# Patient Record
Sex: Female | Born: 1960 | Race: Black or African American | Hispanic: No | Marital: Single | Smoking: Never smoker
Health system: Southern US, Community
[De-identification: ages and names within clinical notes are randomized; demographics above are authoritative.]

## PROBLEM LIST (undated history)

## (undated) DIAGNOSIS — M797 Fibromyalgia: Secondary | ICD-10-CM

## (undated) DIAGNOSIS — E119 Type 2 diabetes mellitus without complications: Secondary | ICD-10-CM

## (undated) DIAGNOSIS — D259 Leiomyoma of uterus, unspecified: Secondary | ICD-10-CM

## (undated) DIAGNOSIS — I10 Essential (primary) hypertension: Secondary | ICD-10-CM

## (undated) HISTORY — PX: BREAST SURGERY: SHX581

## (undated) HISTORY — DX: Type 2 diabetes mellitus without complications: E11.9

## (undated) HISTORY — DX: Essential (primary) hypertension: I10

## (undated) HISTORY — PX: ABDOMINAL HYSTERECTOMY: SHX81

---

## 2011-08-02 ENCOUNTER — Emergency Department (HOSPITAL_BASED_OUTPATIENT_CLINIC_OR_DEPARTMENT_OTHER)
Admission: EM | Admit: 2011-08-02 | Discharge: 2011-08-02 | Disposition: A | Discharge status: Home / Self Care | Payer: Self-pay | Attending: Emergency Medicine | Admitting: Emergency Medicine

## 2011-08-02 ENCOUNTER — Encounter: Payer: Self-pay | Admitting: Emergency Medicine

## 2011-08-02 DIAGNOSIS — N644 Mastodynia: Secondary | ICD-10-CM | POA: Insufficient documentation

## 2011-08-02 HISTORY — DX: Leiomyoma of uterus, unspecified: D25.9

## 2011-08-02 MED ORDER — HYDROCODONE-ACETAMINOPHEN 5-325 MG PO TABS: 1.0000 | ORAL_TABLET | ORAL | Status: AC | PRN

## 2011-08-02 NOTE — ED Provider Notes (Addendum)
History     CSN: [id number] Arrival date & time: 08/02/2011  8:11 AM   None     Chief Complaint  Patient presents with  . Breast Pain    (Consider location/radiation/quality/duration/timing/severity/associated sxs/prior treatment) HPI Comments: Patient is a 50 year old woman who complains of pain in both breasts. She has a prior history of lumps in her breast, and has had breast biopsies on the left breast and also removal of systems left nipple. She had her last mammogram in November of last year at [facility]. She had seen her Dr., a Dr. Nedra Hai at a community clinic in [facility], who did order her mammogram, saying that the patient had too many mammograms. She therefore [facility] for evaluation.   Past Medical History  Diagnosis Date  . Fibroid, uterine     Past Surgical History  Procedure Date  . Breast surgery     benign mass removed  . Abdominal hysterectomy     History reviewed. No pertinent family history.  History  Substance Use Topics  . Smoking status: Never Smoker   . Smokeless tobacco: Not on file  . Alcohol Use: No    OB History    Grav Para Term Preterm Abortions TAB SAB Ect Mult Living                  Review of Systems  Constitutional: Negative.   HENT: Negative.   Eyes: Negative.   Respiratory: Negative.   Cardiovascular: Negative.   Gastrointestinal: Negative.   Genitourinary:       She has tenderness in both breasts.  Musculoskeletal: Negative.   Neurological: Negative.   Psychiatric/Behavioral: Negative.     Allergies  Review of patient's allergies indicates no known allergies.  Home Medications   Current Outpatient Rx  Name Route Sig Dispense Refill  . UNKNOWN TO PATIENT       . HYDROCODONE-ACETAMINOPHEN 5-325 MG PO TABS Oral Take 1 tablet by mouth every 4 (four) hours as needed for pain. 20 tablet 0    BP 127/89  Pulse 76  Temp(Src) 98.2 F (36.8 C) (Oral)  Resp  18  Ht 5\' 4"  (1.626 m)  Wt 174 lb (78.926 kg)  BMI 29.87 kg/m2  SpO2 100%  Physical Exam  Constitutional: She appears well-developed and well-nourished. No distress.  HENT:  Head: Normocephalic.  Eyes: EOM are normal. Pupils are equal, round, and reactive to light.  Neck: Normal range of motion. Neck supple.  Cardiovascular: Normal rate, regular rhythm and normal heart sounds.   Pulmonary/Chest: Effort normal and breath sounds normal.       She has tenderness over both breasts, but there is no mass. She has a scar on the upper surface of her left breast and under the left areola. There is no nipple discharge no axillary adenopathy.  Abdominal: Soft. Bowel sounds are normal.  Neurological: She is alert. She has normal reflexes.       No sensory or motor deficit.  Skin: Skin is warm and dry.  Psychiatric: She has a normal mood and affect. Her behavior is normal.    ED Course  Procedures (including critical care time)  9:00 AM Patient was seen and had physical examination. I ordered a bilateral mammogram for her, to be done at [facility]. I think the risk of radiation is less than the risk of missing a mass.    1. Breast pain  Carleene Cooper III, MD 08/02/11 1610  Carleene Cooper III, MD 08/03/11 [phone]

## 2011-08-02 NOTE — ED Notes (Addendum)
Pt having bilateral breast pain since last week.  Saw Md and was told she has cysts.  Was given medication for pain but is not helping.  Pt denies any nipple drainage or fever.

## 2012-11-15 ENCOUNTER — Emergency Department (HOSPITAL_BASED_OUTPATIENT_CLINIC_OR_DEPARTMENT_OTHER): Payer: No Typology Code available for payment source

## 2012-11-15 ENCOUNTER — Emergency Department (HOSPITAL_BASED_OUTPATIENT_CLINIC_OR_DEPARTMENT_OTHER)
Admission: EM | Admit: 2012-11-15 | Discharge: 2012-11-15 | Disposition: A | Discharge status: Home / Self Care | Payer: No Typology Code available for payment source | Attending: Emergency Medicine | Admitting: Emergency Medicine

## 2012-11-15 ENCOUNTER — Encounter (HOSPITAL_BASED_OUTPATIENT_CLINIC_OR_DEPARTMENT_OTHER): Payer: Self-pay | Admitting: *Deleted

## 2012-11-15 DIAGNOSIS — Y92410 Unspecified street and highway as the place of occurrence of the external cause: Secondary | ICD-10-CM | POA: Insufficient documentation

## 2012-11-15 DIAGNOSIS — S199XXA Unspecified injury of neck, initial encounter: Secondary | ICD-10-CM | POA: Insufficient documentation

## 2012-11-15 DIAGNOSIS — Y9389 Activity, other specified: Secondary | ICD-10-CM | POA: Insufficient documentation

## 2012-11-15 DIAGNOSIS — S298XXA Other specified injuries of thorax, initial encounter: Secondary | ICD-10-CM | POA: Insufficient documentation

## 2012-11-15 DIAGNOSIS — T148XXA Other injury of unspecified body region, initial encounter: Secondary | ICD-10-CM | POA: Insufficient documentation

## 2012-11-15 DIAGNOSIS — S3981XA Other specified injuries of abdomen, initial encounter: Secondary | ICD-10-CM | POA: Insufficient documentation

## 2012-11-15 DIAGNOSIS — S0993XA Unspecified injury of face, initial encounter: Secondary | ICD-10-CM | POA: Insufficient documentation

## 2012-11-15 DIAGNOSIS — Z8742 Personal history of other diseases of the female genital tract: Secondary | ICD-10-CM | POA: Insufficient documentation

## 2012-11-15 DIAGNOSIS — S99919A Unspecified injury of unspecified ankle, initial encounter: Secondary | ICD-10-CM | POA: Insufficient documentation

## 2012-11-15 DIAGNOSIS — S8990XA Unspecified injury of unspecified lower leg, initial encounter: Secondary | ICD-10-CM | POA: Insufficient documentation

## 2012-11-15 DIAGNOSIS — IMO0002 Reserved for concepts with insufficient information to code with codable children: Secondary | ICD-10-CM | POA: Insufficient documentation

## 2012-11-15 DIAGNOSIS — T07XXXA Unspecified multiple injuries, initial encounter: Secondary | ICD-10-CM | POA: Insufficient documentation

## 2012-11-15 LAB — BASIC METABOLIC PANEL
BUN: 9 mg/dL (ref 6–23)
CO2: 27 mEq/L (ref 19–32)
Chloride: 105 mEq/L (ref 96–112)
Creatinine, Ser: 1 mg/dL (ref 0.50–1.10)
Glucose, Bld: 92 mg/dL (ref 70–99)

## 2012-11-15 MED ORDER — ONDANSETRON HCL 4 MG/2ML IJ SOLN
4.0000 mg | Freq: Once | INTRAMUSCULAR | Status: AC
  Administered 2012-11-15: 4 mg via INTRAVENOUS
  Filled 2012-11-15: qty 2

## 2012-11-15 MED ORDER — IOHEXOL 300 MG/ML  SOLN
100.0000 mL | Freq: Once | INTRAMUSCULAR | Status: AC | PRN
  Administered 2012-11-15: 100 mL via INTRAVENOUS

## 2012-11-15 MED ORDER — MORPHINE SULFATE 4 MG/ML IJ SOLN
4.0000 mg | Freq: Once | INTRAMUSCULAR | Status: AC
  Administered 2012-11-15: 4 mg via INTRAVENOUS
  Filled 2012-11-15: qty 1

## 2012-11-15 MED ORDER — OXYCODONE-ACETAMINOPHEN 5-325 MG PO TABS: 1.0000 | ORAL_TABLET | Freq: Four times a day (QID) | ORAL | Status: DC | PRN

## 2012-11-15 MED ORDER — CYCLOBENZAPRINE HCL 10 MG PO TABS: 10.0000 mg | ORAL_TABLET | Freq: Three times a day (TID) | ORAL | Status: DC | PRN
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
## 2013-01-19 ENCOUNTER — Inpatient Hospital Stay: Admit: 2013-01-19 | Payer: Self-pay

## 2013-01-19 ENCOUNTER — Encounter (HOSPITAL_BASED_OUTPATIENT_CLINIC_OR_DEPARTMENT_OTHER): Payer: Self-pay

## 2013-01-19 ENCOUNTER — Emergency Department (HOSPITAL_BASED_OUTPATIENT_CLINIC_OR_DEPARTMENT_OTHER)
Admission: EM | Admit: 2013-01-19 | Discharge: 2013-01-19 | Disposition: A | Discharge status: Home / Self Care | Payer: Self-pay | Attending: Emergency Medicine | Admitting: Emergency Medicine

## 2013-01-19 DIAGNOSIS — N63 Unspecified lump in unspecified breast: Secondary | ICD-10-CM | POA: Insufficient documentation

## 2013-01-19 DIAGNOSIS — Z8742 Personal history of other diseases of the female genital tract: Secondary | ICD-10-CM | POA: Insufficient documentation

## 2013-01-19 DIAGNOSIS — N6310 Unspecified lump in the right breast, unspecified quadrant: Secondary | ICD-10-CM

## 2013-01-19 NOTE — ED Notes (Signed)
Pt reports right breast tenderness that started yesterday.  Pain increases with clothing touching her skin and on palpation.  No mass palpated and pain is described as severe.

## 2013-01-19 NOTE — ED Notes (Signed)
Pt reports onset of right breast pain that started yesterday.

## 2013-01-19 NOTE — ED Provider Notes (Signed)
History     CSN: [id number]  Arrival date & time 01/19/13  4098   First MD Initiated Contact with Patient 01/19/13 [phone]      Chief Complaint  Patient presents with  . Breast Pain    (Consider location/radiation/quality/duration/timing/severity/associated sxs/prior treatment) HPI Pt with history of breast lumps, previously followed in [place] had lumpectomy of benign mass on L breast several years ago. She reports severe pain in R breast since yesterday, worse with light touch but no rash and no nipple discharge. She states last mammogram was 'last year' at [facility] but no longer has follow up due to lack of insurance. She denies any fever.   Past Medical History  Diagnosis Date  . Fibroid, uterine     Past Surgical History  Procedure Laterality Date  . Breast surgery      benign mass removed  . Abdominal hysterectomy      No family history on file.  History  Substance Use Topics  . Smoking status: Never Smoker   . Smokeless tobacco: Not on file  . Alcohol Use: No    OB History   Grav Para Term Preterm Abortions TAB SAB Ect Mult Living                  Review of Systems All other systems reviewed and are negative except as noted in HPI.   Allergies  Review of patient's allergies indicates no known allergies.  Home Medications   Current Outpatient Rx  Name  Route  Sig  Dispense  Refill  . cyclobenzaprine (FLEXERIL) 10 MG tablet   Oral   Take 1 tablet (10 mg total) by mouth 3 (three) times daily as needed for muscle spasms.   20 tablet   0   . oxyCODONE-acetaminophen (PERCOCET/ROXICET) 5-325 MG per tablet   Oral   Take 1 tablet by mouth every 6 (six) hours as needed for pain.   15 tablet   0   . UNKNOWN TO PATIENT                  BP 133/80  Pulse 80  Temp(Src) 97.6 F (36.4 C) (Oral)  Resp 16  Ht 5\' 6"  (1.676 m)  Wt 170 lb (77.111 kg)  BMI 27.45 kg/m2  SpO2 100%  Physical Exam  Nursing note and vitals reviewed. Constitutional: She is  oriented to person, place, and time. She appears well-developed and well-nourished.  HENT:  Head: Normocephalic and atraumatic.  Eyes: EOM are normal. Pupils are equal, round, and reactive to light.  Neck: Normal range of motion. Neck supple.  Cardiovascular: Normal rate, normal heart sounds and intact distal pulses.   Pulmonary/Chest: Effort normal and breath sounds normal. Right breast exhibits mass and tenderness. Right breast exhibits no inverted nipple and no nipple discharge.    No signs of abscess  Abdominal: Bowel sounds are normal. She exhibits no distension. There is no tenderness.  Musculoskeletal: Normal range of motion. She exhibits no edema and no tenderness.  Neurological: She is alert and oriented to person, place, and time. She has normal strength. No cranial nerve deficit or sensory deficit.  Skin: Skin is warm and dry. No rash noted.  Psychiatric: She has a normal mood and affect.    ED Course  Procedures (including critical care time)  Labs Reviewed - No data to display No results found.   1. Breast mass, right       MDM  Tender mass in R breast. Pt  previously managed with breast complaints in [facility], but has been lost to followup. She was seen in this ED for similar complaints about 18months ago and sent to [facility] for mammogram but no further information available from that workup. I spoke with Dr. Manson Passey at [facility] and the patient has been scheduled for mammogram and [place] this afternoon. Pt advised to go to that location         [place] B. Bernette Mayers, MD 01/19/13 1055

## 2013-01-20 ENCOUNTER — Encounter (HOSPITAL_COMMUNITY): Payer: Self-pay

## 2013-01-20 ENCOUNTER — Ambulatory Visit
Admission: RE | Admit: 2013-01-20 | Discharge: 2013-01-20 | Disposition: A | Discharge status: Home / Self Care | Payer: No Typology Code available for payment source | Source: Ambulatory Visit | Attending: Emergency Medicine | Admitting: Emergency Medicine

## 2013-01-20 ENCOUNTER — Ambulatory Visit (HOSPITAL_COMMUNITY)
Admission: RE | Admit: 2013-01-20 | Discharge: 2013-01-20 | Disposition: A | Discharge status: Home / Self Care | Payer: Self-pay | Source: Ambulatory Visit | Attending: Obstetrics and Gynecology | Admitting: Obstetrics and Gynecology

## 2013-01-20 VITALS — BP 135/87 | HR 66 | Temp 97.7°F | Ht 66.0 in | Wt 175.4 lb

## 2013-01-20 DIAGNOSIS — N6310 Unspecified lump in the right breast, unspecified quadrant: Secondary | ICD-10-CM

## 2013-01-20 DIAGNOSIS — Z1239 Encounter for other screening for malignant neoplasm of breast: Secondary | ICD-10-CM

## 2013-01-20 DIAGNOSIS — N6311 Unspecified lump in the right breast, upper outer quadrant: Secondary | ICD-10-CM | POA: Insufficient documentation

## 2013-01-20 NOTE — Progress Notes (Signed)
Patient complained of right breast lump and pain. Patient rated pain at a 10 out of 10 stating that it is worse to touch.  Pap Smear:    Pap smear not completed today. Per patient last Pap smear was greater than 15 years ago. Per patient she has no history of an abnormal Pap smear. Patient has a history of a hysterectomy for fibroids 15 years ago. Pap smear no indicated per BCCCP and ACOG guidelines. No Pap smear results in EPIC.  Physical exam: Breasts Breasts symmetrical. No skin abnormalities bilateral breasts. No nipple retraction bilateral breasts. No nipple discharge bilateral breasts. No lymphadenopathy. No lumps palpated left breast. Palpated lump within the right breast at 11 o'clock 2-3 cm from the areola. Patient complained of pain and tenderness within the center of the right breast that was greater when palpated lump. Referred patient to the [facility] for bilateral diagnostic mammogram and right breast ultrasound. Appointment to follow BCCCP appointment.     Pelvic/Bimanual No Pap smear completed today since patient has a history of a hysterectomy for benign reasons. Pap smear not indicated per BCCCP guidelines.

## 2013-01-20 NOTE — Patient Instructions (Signed)
Taught patient how to perform BSE. Patient did not need a Pap smear due to a history of a hysterectomy for benign reasons. Let patient know that she would not need any further Pap smears. Referred patient to the [facility] for bilateral diagnostic mammogram and right breast ultrasound. Appointment scheduled after BCCCP appointment today. Patient verbalized understanding.

## 2013-06-22 DIAGNOSIS — Z8719 Personal history of other diseases of the digestive system: Secondary | ICD-10-CM | POA: Insufficient documentation

## 2013-12-21 ENCOUNTER — Other Ambulatory Visit: Payer: Self-pay

## 2014-01-05 ENCOUNTER — Other Ambulatory Visit: Payer: Self-pay

## 2014-01-05 DIAGNOSIS — Z1231 Encounter for screening mammogram for malignant neoplasm of breast: Secondary | ICD-10-CM

## 2014-01-22 ENCOUNTER — Ambulatory Visit: Payer: Self-pay

## 2014-05-13 ENCOUNTER — Encounter (HOSPITAL_BASED_OUTPATIENT_CLINIC_OR_DEPARTMENT_OTHER): Payer: Self-pay | Admitting: Emergency Medicine

## 2014-05-13 ENCOUNTER — Emergency Department (HOSPITAL_BASED_OUTPATIENT_CLINIC_OR_DEPARTMENT_OTHER)
Admission: EM | Admit: 2014-05-13 | Discharge: 2014-05-13 | Disposition: A | Discharge status: Home / Self Care | Payer: No Typology Code available for payment source | Attending: Emergency Medicine | Admitting: Emergency Medicine

## 2014-05-13 DIAGNOSIS — Z87448 Personal history of other diseases of urinary system: Secondary | ICD-10-CM | POA: Insufficient documentation

## 2014-05-13 DIAGNOSIS — M797 Fibromyalgia: Secondary | ICD-10-CM

## 2014-05-13 DIAGNOSIS — IMO0001 Reserved for inherently not codable concepts without codable children: Secondary | ICD-10-CM | POA: Insufficient documentation

## 2014-05-13 DIAGNOSIS — Z79899 Other long term (current) drug therapy: Secondary | ICD-10-CM | POA: Insufficient documentation

## 2014-05-13 DIAGNOSIS — R52 Pain, unspecified: Secondary | ICD-10-CM | POA: Insufficient documentation

## 2014-05-13 HISTORY — DX: Fibromyalgia: M79.7

## 2014-05-13 MED ORDER — TRAMADOL HCL 50 MG PO TABS
50.0000 mg | ORAL_TABLET | Freq: Once | ORAL | Status: AC
  Administered 2014-05-13: 50 mg via ORAL
  Filled 2014-05-13: qty 1

## 2014-05-13 NOTE — ED Provider Notes (Signed)
Medical screening examination/treatment/procedure(s) were performed by non-physician practitioner and as supervising physician I was immediately available for consultation/collaboration.   EKG Interpretation None       Threasa Beards, MD 05/13/14 1354

## 2014-05-13 NOTE — Discharge Instructions (Signed)
Followup with your primary care physician. Avoid air-conditioning.  Fibromyalgia Fibromyalgia is a disorder that is often misunderstood. It is associated with muscular pains and tenderness that comes and goes. It is often associated with fatigue and sleep disturbances. Though it tends to be long-lasting, fibromyalgia is not life-threatening. CAUSES  The exact cause of fibromyalgia is unknown. People with certain gene types are predisposed to developing fibromyalgia and other conditions. Certain factors can play a role as triggers, such as:  Spine disorders.  Arthritis.  Severe injury (trauma) and other physical stressors.  Emotional stressors. SYMPTOMS   The main symptom is pain and stiffness in the muscles and joints, which can vary over time.  Sleep and fatigue problems. Other related symptoms may include:  Bowel and bladder problems.  Headaches.  Visual problems.  Problems with odors and noises.  Depression or mood changes.  Painful periods (dysmenorrhea).  Dryness of the skin or eyes. DIAGNOSIS  There are no specific tests for diagnosing fibromyalgia. Patients can be diagnosed accurately from the specific symptoms they have. The diagnosis is made by determining that nothing else is causing the problems. TREATMENT  There is no cure. Management includes medicines and an active, healthy lifestyle. The goal is to enhance physical fitness, decrease pain, and improve sleep. HOME CARE INSTRUCTIONS   Only take over-the-counter or prescription medicines as directed by your caregiver. Sleeping pills, tranquilizers, and pain medicines may make your problems worse.  Low-impact aerobic exercise is very important and advised for treatment. At first, it may seem to make pain worse. Gradually increasing your tolerance will overcome this feeling.  Learning relaxation techniques and how to control stress will help you. Biofeedback, visual imagery, hypnosis, muscle relaxation, yoga, and  meditation are all options.  Anti-inflammatory medicines and physical therapy may provide short-term help.  Acupuncture or massage treatments may help.  Take muscle relaxant medicines as suggested by your caregiver.  Avoid stressful situations.  Plan a healthy lifestyle. This includes your diet, sleep, rest, exercise, and friends.  Find and practice a hobby you enjoy.  Join a fibromyalgia support group for interaction, ideas, and sharing advice. This may be helpful. SEEK MEDICAL CARE IF:  You are not having good results or improvement from your treatment. FOR MORE INFORMATION  National Fibromyalgia Association: www.fmaware.[place]: www.arthritis.org Document Released: 09/14/2005 Document Revised: 12/07/2011 Document Reviewed: 12/25/2009 [facility] Patient Information 2015 [place], [place]. This information is not intended to replace advice given to you by your health care provider. Make sure you discuss any questions you have with your health care provider.

## 2014-05-13 NOTE — ED Notes (Signed)
Pt presents to ED with complaints of her fibromyalgia pain . PT states it normally starts in her back and goes thru to her epigastric area. Pt crying and rocking during triage, states "I can't take air condition" that cold air makes it worse.

## 2014-05-13 NOTE — ED Provider Notes (Signed)
CSN: [id number]     Arrival date & time 05/13/14  1153 History   First MD Initiated Contact with Patient 05/13/14 1305     Chief Complaint  Patient presents with  . Generalized Body Aches     (Consider location/radiation/quality/duration/timing/severity/associated sxs/prior Treatment) HPI Comments: Patient is a 53 year old female past medical history fibromyalgia who presents to the emergency department complaining of "fibromyalgia pain" beginning at church earlier today. Pain located underneath her left breast. States this is normally where she gets fibromyalgia pain. If her pain is exacerbated by air conditioning, and air conditioning was on at church. Pain initially 10 out of 10, slowly starting to decrease when she left the air conditioning. States she took her Savella earlier today as prescribed and is also prescribed Aleve for pain, however states her PCP told her she could not take Aleve with her Savella. Denies recent fever, chills, rashes, chest pain or shortness of breath.  The history is provided by the patient.    Past Medical History  Diagnosis Date  . Fibroid, uterine   . Fibromyalgia    Past Surgical History  Procedure Laterality Date  . Abdominal hysterectomy    . Breast surgery      benign mass removed/ left breast   Family History  Problem Relation Age of Onset  . Cancer Mother     breast  . Hypertension Mother   . Hypertension Father   . Hypertension Sister   . Hypertension Brother   . Cancer Maternal Grandmother     lung   History  Substance Use Topics  . Smoking status: Never Smoker   . Smokeless tobacco: Never Used  . Alcohol Use: No   OB History   Grav Para Term Preterm Abortions TAB SAB Ect Mult Living   0              Review of Systems  Musculoskeletal: Positive for myalgias.  All other systems reviewed and are negative.     Allergies  Review of patient's allergies indicates no known allergies.  Home Medications   Prior to Admission  medications   Medication Sig Start Date End Date Taking? Authorizing Provider  Milnacipran (SAVELLA) 50 MG TABS tablet Take 50 mg by mouth 2 (two) times daily.   Yes Historical Provider, MD  cyclobenzaprine (FLEXERIL) 10 MG tablet Take 1 tablet (10 mg total) by mouth 3 (three) times daily as needed for muscle spasms. 11/15/12   Blanchie Dessert, MD  oxyCODONE-acetaminophen (PERCOCET/ROXICET) 5-325 MG per tablet Take 1 tablet by mouth every 6 (six) hours as needed for pain. 11/15/12   Blanchie Dessert, MD  UNKNOWN TO PATIENT      Historical Provider, MD   BP 173/95  Pulse 87  Temp(Src) 97.9 F (36.6 C) (Oral)  Resp 22  Ht 5\' 6"  (1.676 m)  Wt 160 lb (72.576 kg)  BMI 25.84 kg/m2  SpO2 100% Physical Exam  Nursing note and vitals reviewed. Constitutional: She is oriented to person, place, and time. She appears well-developed and well-nourished. No distress.  HENT:  Head: Normocephalic and atraumatic.  Mouth/Throat: Oropharynx is clear and moist.  Eyes: Conjunctivae are normal.  Neck: Normal range of motion. Neck supple.  Cardiovascular: Normal rate, regular rhythm and normal heart sounds.   Pulmonary/Chest: Effort normal and breath sounds normal.  TTP underneath left breast. No rash.  Abdominal: Soft. Bowel sounds are normal. There is no tenderness.  Musculoskeletal: Normal range of motion. She exhibits no edema.  Neurological: She is alert  and oriented to person, place, and time.  Skin: Skin is warm and dry. She is not diaphoretic.  Psychiatric: She has a normal mood and affect. Her behavior is normal.    ED Course  Procedures (including critical care time) Labs Review Labs Reviewed - No data to display  Imaging Review No results found.   EKG Interpretation None      MDM   Final diagnoses:  Muscle pain, fibromyalgia   Patient presenting with exacerbation of fibromyalgia pain after being in air conditioning. She is well appearing and in no apparent distress. Afebrile,  vital signs stable. No associated rash in the area of pain under her left breast. I advised her to avoid air conditioning. Her symptoms are resolving after being out of the air conditioning. Stable for discharge. Return precautions given. Patient states understanding of treatment care plan and is agreeable.   Illene Labrador, PA-C 05/13/14 1341

## 2016-08-12 ENCOUNTER — Emergency Department (HOSPITAL_BASED_OUTPATIENT_CLINIC_OR_DEPARTMENT_OTHER)
Admission: EM | Admit: 2016-08-12 | Discharge: 2016-08-12 | Disposition: A | Discharge status: Home / Self Care | Payer: Self-pay | Attending: Physician Assistant | Admitting: Physician Assistant

## 2016-08-12 ENCOUNTER — Emergency Department (HOSPITAL_BASED_OUTPATIENT_CLINIC_OR_DEPARTMENT_OTHER): Payer: Self-pay

## 2016-08-12 ENCOUNTER — Encounter (HOSPITAL_BASED_OUTPATIENT_CLINIC_OR_DEPARTMENT_OTHER): Payer: Self-pay | Admitting: *Deleted

## 2016-08-12 DIAGNOSIS — R531 Weakness: Secondary | ICD-10-CM | POA: Insufficient documentation

## 2016-08-12 LAB — CBC WITH DIFFERENTIAL/PLATELET
Basophils Absolute: 0 10*3/uL (ref 0.0–0.1)
Basophils Relative: 0 %
EOS ABS: 0.1 10*3/uL (ref 0.0–0.7)
Eosinophils Relative: 2 %
HEMATOCRIT: 37.6 % (ref 36.0–46.0)
HEMOGLOBIN: 12.4 g/dL (ref 12.0–15.0)
LYMPHS ABS: 1.4 10*3/uL (ref 0.7–4.0)
LYMPHS PCT: 26 %
MCH: 24.8 pg — AB (ref 26.0–34.0)
MCHC: 33 g/dL (ref 30.0–36.0)
MCV: 75 fL — ABNORMAL LOW (ref 78.0–100.0)
MONOS PCT: 6 %
Monocytes Absolute: 0.3 10*3/uL (ref 0.1–1.0)
NEUTROS ABS: 3.7 10*3/uL (ref 1.7–7.7)
NEUTROS PCT: 66 %
Platelets: 306 10*3/uL (ref 150–400)
RBC: 5.01 MIL/uL (ref 3.87–5.11)
RDW: 16.3 % — ABNORMAL HIGH (ref 11.5–15.5)
WBC: 5.6 10*3/uL (ref 4.0–10.5)

## 2016-08-12 LAB — URINALYSIS, ROUTINE W REFLEX MICROSCOPIC
BILIRUBIN URINE: NEGATIVE
GLUCOSE, UA: 100 mg/dL — AB
Hgb urine dipstick: NEGATIVE
KETONES UR: NEGATIVE mg/dL
Leukocytes, UA: NEGATIVE
NITRITE: NEGATIVE
PH: 7 (ref 5.0–8.0)
PROTEIN: NEGATIVE mg/dL
Specific Gravity, Urine: 1.021 (ref 1.005–1.030)

## 2016-08-12 LAB — COMPREHENSIVE METABOLIC PANEL
ALK PHOS: 75 U/L (ref 38–126)
ALT: 24 U/L (ref 14–54)
ANION GAP: 5 (ref 5–15)
AST: 27 U/L (ref 15–41)
Albumin: 4.1 g/dL (ref 3.5–5.0)
BILIRUBIN TOTAL: 0.4 mg/dL (ref 0.3–1.2)
BUN: 13 mg/dL (ref 6–20)
CALCIUM: 8.9 mg/dL (ref 8.9–10.3)
CO2: 28 mmol/L (ref 22–32)
CREATININE: 0.7 mg/dL (ref 0.44–1.00)
Chloride: 106 mmol/L (ref 101–111)
GFR calc non Af Amer: >60 mL/min (ref >60)
GLUCOSE: 145 mg/dL — AB (ref 65–99)
Potassium: 4 mmol/L (ref 3.5–5.1)
SODIUM: 139 mmol/L (ref 135–145)
TOTAL PROTEIN: 7.7 g/dL (ref 6.5–8.1)

## 2016-08-12 MED ORDER — IBUPROFEN 800 MG PO TABS
800.0000 mg | ORAL_TABLET | Freq: Once | ORAL | Status: AC
  Administered 2016-08-12: 800 mg via ORAL
  Filled 2016-08-12: qty 1

## 2016-08-12 NOTE — ED Notes (Signed)
Patient transported to X-ray 

## 2016-08-12 NOTE — ED Provider Notes (Signed)
[facility] Provider Note   CSN: [id number] Arrival date & time: 08/12/16  1500     History   Chief Complaint Chief Complaint  Patient presents with  . Weakness    HPI Laura Obrien is a 55 y.o. female.  HPI\  Patient is a 55 year old female presents with fiber myalgia. Patient on multiple Medications for disability for fibromyalgia. She is presenting here with vague symptom of weakness. Patient feels fatigued, weak. No cough congestion fever nausea vomiting diarrhea urinary symptoms or other complaints.  Patient has had no recent travel, change in occasions or other pertinent history of present illness.  Past Medical History:  Diagnosis Date  . Fibroid, uterine   . Fibromyalgia     Patient Active Problem List   Diagnosis Date Noted  . Breast lump on right side at 11 o'clock position 01/20/2013    Past Surgical History:  Procedure Laterality Date  . ABDOMINAL HYSTERECTOMY    . BREAST SURGERY     benign mass removed/ left breast    OB History    Gravida Para Term Preterm AB Living   0             SAB TAB Ectopic Multiple Live Births                   Home Medications    Prior to Admission medications   Medication Sig Start Date End Date Taking? Authorizing Provider  traMADol (ULTRAM) 50 MG tablet Take by mouth every 6 (six) hours as needed.   Yes Historical Provider, MD  Milnacipran (SAVELLA) 50 MG TABS tablet Take 50 mg by mouth 2 (two) times daily.    Historical Provider, MD  oxyCODONE-acetaminophen (PERCOCET/ROXICET) 5-325 MG per tablet Take 1 tablet by mouth every 6 (six) hours as needed for pain. 11/15/12   Blanchie Dessert, MD  UNKNOWN TO PATIENT      Historical Provider, MD    Family History Family History  Problem Relation Age of Onset  . Cancer Mother     breast  . Hypertension Mother   . Cancer Maternal Grandmother     lung  . Hypertension Father   . Hypertension Sister   . Hypertension Brother     Social  History Social History  Substance Use Topics  . Smoking status: Never Smoker  . Smokeless tobacco: Never Used  . Alcohol use No     Allergies   Patient has no known allergies.   Review of Systems Review of Systems  Constitutional: Positive for fatigue. Negative for fever.  Respiratory: Negative for cough and chest tightness.   Cardiovascular: Negative for chest pain.  Gastrointestinal: Negative for abdominal pain.  Genitourinary: Negative for flank pain.  Musculoskeletal: Negative for back pain.  Neurological: Negative for dizziness.  All other systems reviewed and are negative.    Physical Exam Updated Vital Signs BP 156/99   Pulse 76   Temp 98.3 F (36.8 C)   Resp 18   Ht 5\' 6"  (1.676 m)   Wt 162 lb (73.5 kg)   SpO2 99%   BMI 26.15 kg/m   Physical Exam  Constitutional: She is oriented to person, place, and time. She appears well-developed and well-nourished.  HENT:  Head: Normocephalic and atraumatic.  Eyes: Conjunctivae are normal. Right eye exhibits no discharge. Left eye exhibits no discharge.  Disconjugate gaze  Neck: Neck supple.  Cardiovascular: Normal rate, regular rhythm and normal heart sounds.   No murmur heard. Pulmonary/Chest: Effort normal and  breath sounds normal. She has no wheezes. She has no rales.  Abdominal: Soft. She exhibits no distension. There is no tenderness.  Musculoskeletal: Normal range of motion. She exhibits no edema.  Neurological: She is oriented to person, place, and time. No cranial nerve deficit.  Skin: Skin is warm and dry. No rash noted. She is not diaphoretic.  Psychiatric: She has a normal mood and affect. Her behavior is normal.  Nursing note and vitals reviewed.    ED Treatments / Results  Labs (all labs ordered are listed, but only abnormal results are displayed) Labs Reviewed - No data to display  EKG  EKG Interpretation None       Radiology No results found.  Procedures Procedures (including  critical care time)  Medications Ordered in ED Medications - No data to display   Initial Impression / Assessment and Plan / ED Course  I have reviewed the triage vital signs and the nursing notes.  Pertinent labs & imaging results that were available during my care of the patient were reviewed by me and considered in my medical decision making (see chart for details).  Clinical Course     Patient is a 55 year old female with fibromyalgia. Patient reports vague symptom of weakness. Patient has primary care physician. We will rule out any kind infection including urine or pneumonia. If both negative we'll plan to discharge with PCP follow-up instructions to rest, drink plenty of fluids and use ibuprofen and Tylenol for her symptoms.  Final Clinical Impressions(s) / ED Diagnoses   Final diagnoses:  None    New Prescriptions New Prescriptions   No medications on file     Valjean Ruppel Julio Alm, MD 08/12/16 2004

## 2016-08-12 NOTE — ED Triage Notes (Signed)
Pt c/o generalized weakness  And fatigue x 1 day

## 2016-08-12 NOTE — ED Notes (Signed)
ED Provider at bedside. 

## 2016-08-12 NOTE — Discharge Instructions (Signed)
We are unsure causing your weakness. Your urine and chest x-ray showed no infection. Your labs are all normal. Yourr vital signs are reassuring.Please watch out for any signs of infections could be early viral syndrome. Drink plenty of fluids and rest at home. Please follow-up with your primary care physician this week.

## 2016-11-02 ENCOUNTER — Encounter (HOSPITAL_BASED_OUTPATIENT_CLINIC_OR_DEPARTMENT_OTHER): Payer: Self-pay

## 2016-11-02 ENCOUNTER — Emergency Department (HOSPITAL_BASED_OUTPATIENT_CLINIC_OR_DEPARTMENT_OTHER)
Admission: EM | Admit: 2016-11-02 | Discharge: 2016-11-02 | Disposition: A | Discharge status: Home / Self Care | Payer: Self-pay | Attending: Emergency Medicine | Admitting: Emergency Medicine

## 2016-11-02 ENCOUNTER — Emergency Department (HOSPITAL_BASED_OUTPATIENT_CLINIC_OR_DEPARTMENT_OTHER): Payer: Self-pay

## 2016-11-02 DIAGNOSIS — J111 Influenza due to unidentified influenza virus with other respiratory manifestations: Secondary | ICD-10-CM

## 2016-11-02 DIAGNOSIS — R69 Illness, unspecified: Secondary | ICD-10-CM

## 2016-11-02 DIAGNOSIS — J029 Acute pharyngitis, unspecified: Secondary | ICD-10-CM | POA: Insufficient documentation

## 2016-11-02 DIAGNOSIS — R05 Cough: Secondary | ICD-10-CM | POA: Insufficient documentation

## 2016-11-02 DIAGNOSIS — R112 Nausea with vomiting, unspecified: Secondary | ICD-10-CM | POA: Insufficient documentation

## 2016-11-02 DIAGNOSIS — R509 Fever, unspecified: Secondary | ICD-10-CM | POA: Insufficient documentation

## 2016-11-02 DIAGNOSIS — M791 Myalgia: Secondary | ICD-10-CM | POA: Insufficient documentation

## 2016-11-02 MED ORDER — HYDROCODONE-ACETAMINOPHEN 5-325 MG PO TABS: ORAL_TABLET | ORAL | 0 refills | Status: DC

## 2016-11-02 MED ORDER — PROMETHAZINE HCL 25 MG PO TABS: 25.0000 mg | ORAL_TABLET | Freq: Four times a day (QID) | ORAL | 0 refills | Status: DC | PRN

## 2016-11-02 MED ORDER — HYDROCODONE-ACETAMINOPHEN 5-325 MG PO TABS
ORAL_TABLET | ORAL | 0 refills | Status: DC
Start: 2016-11-02 — End: 2016-11-02

## 2016-11-02 MED FILL — HYDROCODON-APAP 5-325: 5-325 | 2 days supply | Qty: 11 | Fill #0

## 2016-11-02 MED FILL — PROMETHAZINE 25 MG TABLET: 25 | 3 days supply | Qty: 12 | Fill #0

## 2016-11-02 NOTE — ED Provider Notes (Signed)
[facility] Provider Note   CSN: [id number] Arrival date & time: 11/02/16  1234     History   Chief Complaint Chief Complaint  Patient presents with  . Cough    HPI  Blood pressure 139/93, pulse 87, temperature 98.4 F (36.9 C), temperature source Oral, resp. rate 20, height 5\' 6"  (1.676 m), weight 79.8 kg, SpO2 100 %.  Laura Obrien is a 56 y.o. female complaining of complaining of nausea, vomiting, cough, sore throat, myalgia, tactile fever and chills onset over 2 weeks ago. Symptoms waxing and waning but overall not improving. She denies any focal chest pain, shortness of breath, cervical pain, change in urination.   Past Medical History:  Diagnosis Date  . Fibroid, uterine   . Fibromyalgia     Patient Active Problem List   Diagnosis Date Noted  . Breast lump on right side at 11 o'clock position 01/20/2013    Past Surgical History:  Procedure Laterality Date  . ABDOMINAL HYSTERECTOMY    . BREAST SURGERY     benign mass removed/ left breast    OB History    Gravida Para Term Preterm AB Living   0             SAB TAB Ectopic Multiple Live Births                   Home Medications    Prior to Admission medications   Medication Sig Start Date End Date Taking? Authorizing Provider  HYDROcodone-acetaminophen (NORCO/VICODIN) 5-325 MG tablet Take 1-2 tablets by mouth every 6 hours as needed for pain and/or cough. 11/02/16   Elmyra Ricks Zacharie Portner, PA-C  Milnacipran (SAVELLA) 50 MG TABS tablet Take 50 mg by mouth 2 (two) times daily.    Historical Provider, MD  oxyCODONE-acetaminophen (PERCOCET/ROXICET) 5-325 MG per tablet Take 1 tablet by mouth every 6 (six) hours as needed for pain. 11/15/12   Blanchie Dessert, MD  promethazine (PHENERGAN) 25 MG tablet Take 1 tablet (25 mg total) by mouth every 6 (six) hours as needed for nausea or vomiting. 11/02/16   Elianie Hubers, PA-C  traMADol (ULTRAM) 50 MG tablet Take by mouth every 6 (six) hours as needed.     Historical Provider, MD  UNKNOWN TO PATIENT      Historical Provider, MD    Family History Family History  Problem Relation Age of Onset  . Cancer Mother     breast  . Hypertension Mother   . Cancer Maternal Grandmother     lung  . Hypertension Father   . Hypertension Sister   . Hypertension Brother     Social History Social History  Substance Use Topics  . Smoking status: Never Smoker  . Smokeless tobacco: Never Used  . Alcohol use No     Allergies   Patient has no known allergies.   Review of Systems Review of Systems  10 systems reviewed and found to be negative, except as noted in the HPI.   Physical Exam Updated Vital Signs BP 139/93 (BP Location: Left Arm)   Pulse 87   Temp 98.4 F (36.9 C) (Oral)   Resp 20   Ht 5\' 6"  (1.676 m)   Wt 79.8 kg   SpO2 100%   BMI 28.41 kg/m   Physical Exam  Constitutional: She appears well-developed and well-nourished.  HENT:  Head: Normocephalic.  Right Ear: External ear normal.  Left Ear: External ear normal.  Mouth/Throat: Oropharynx is clear and moist. No oropharyngeal exudate.  No drooling or stridor. Posterior pharynx mildly erythematous no significant tonsillar hypertrophy. No exudate. Soft palate rises symmetrically. No TTP or induration under tongue.   No tenderness to palpation of frontal or bilateral maxillary sinuses.  Mild mucosal edema in the nares with scant rhinorrhea.  Bilateral tympanic membranes with normal architecture and good light reflex.    Eyes: Conjunctivae and EOM are normal. Pupils are equal, round, and reactive to light.  Neck: Normal range of motion. Neck supple.  Cardiovascular: Normal rate and regular rhythm.   Pulmonary/Chest: Effort normal and breath sounds normal. No stridor. No respiratory distress. She has no wheezes. She has no rales. She exhibits no tenderness.  Abdominal: Soft. There is no tenderness. There is no rebound and no guarding.  Nursing note and vitals  reviewed.    ED Treatments / Results  Labs (all labs ordered are listed, but only abnormal results are displayed) Labs Reviewed - No data to display  EKG  EKG Interpretation None       Radiology Dg Chest 2 View  Result Date: 11/02/2016 CLINICAL DATA:  Two week history of cough, fever, chest congestion, diarrhea and vomiting. EXAM: CHEST  2 VIEW COMPARISON:  08/12/2016, 11/15/2012 and earlier. FINDINGS: Cardiomediastinal silhouette unremarkable, unchanged. Lungs clear. Bronchovascular markings normal. Pulmonary vascularity normal. No visible pleural effusions. No pneumothorax. Visualized bony thorax intact. No interval change. IMPRESSION: No acute cardiopulmonary disease.  Stable examination. Electronically Signed   By: Evangeline Dakin M.D.   On: 11/02/2016 14:14    Procedures Procedures (including critical care time)  Medications Ordered in ED Medications - No data to display   Initial Impression / Assessment and Plan / ED Course  I have reviewed the triage vital signs and the nursing notes.  Pertinent labs & imaging results that were available during my care of the patient were reviewed by me and considered in my medical decision making (see chart for details).    Vitals:   11/02/16 1249 11/02/16 1250  BP: 139/93   Pulse: 87   Resp: 20   Temp: 98.4 F (36.9 C)   TempSrc: Oral   SpO2: 100%   Weight:  79.8 kg  Height:  5\' 6"  (1.676 m)    Laura Obrien is 56 y.o. female presenting with symptoms consistent with influenza. Patient is nontoxic appearing. Vital signs reassuring with excellent oxygen saturation, lung sounds are clear to auscultation, doubt superimposed pneumonia. She has no other comorbidities. In shared decision-making we have discussed the pros and cons of initiating Tamiflu and patient declines. I counseled patient on infection control precautions. We've had an extensive discussion of return precautions and patient verbalizes understanding and teach back  technique.   Evaluation does not show pathology that would require ongoing emergent intervention or inpatient treatment. Pt is hemodynamically stable and mentating appropriately. Discussed findings and plan with patient/guardian, who agrees with care plan. All questions answered. Return precautions discussed and outpatient follow up given.      Final Clinical Impressions(s) / ED Diagnoses   Final diagnoses:  Influenza-like illness    New Prescriptions New Prescriptions   HYDROCODONE-ACETAMINOPHEN (NORCO/VICODIN) 5-325 MG TABLET    Take 1-2 tablets by mouth every 6 hours as needed for pain and/or cough.   PROMETHAZINE (PHENERGAN) 25 MG TABLET    Take 1 tablet (25 mg total) by mouth every 6 (six) hours as needed for nausea or vomiting.     Monico Blitz, PA-C 11/02/16 [place], MD 11/02/16 [phone]

## 2016-11-02 NOTE — Discharge Instructions (Signed)
Return to the emergency room for any worsening or concerning symptoms including fast breathing, heart racing, confusion, vomiting.  Rest, cover your mouth when you cough and wash your hands frequently.   Push fluids: water or Gatorade, do not drink any soda, juice or caffeinated beverages.  For fever and pain control you can take Motrin (ibuprofen) as follows: 400 mg (this is normally 2 over the counter pills) Three hours after you have had the Motrin take Tylenol (acetaminophen) as follows: You can take 650 mg (this is normally 2 over-the-counter pills) Repeat the series by taking Motrin 3 hours later, continue to do this while you are awake.  Check to see that any other over-the-counter medications don't contain acetaminophen: you shouldn't have more than 3000 mg a day.  Do not return to work until 48 hours after your fever breaks.   Do not hesitate to return to the emergency room for any new, worsening or concerning symptoms.  Please obtain primary care using resource guide below. Let them know that you were seen in the emergency room and that they will need to obtain records for further outpatient management.

## 2016-11-02 NOTE — ED Triage Notes (Signed)
C/o flu s/s x 2 weeks-NAD-steady gait

## 2017-08-18 DIAGNOSIS — E785 Hyperlipidemia, unspecified: Secondary | ICD-10-CM | POA: Insufficient documentation

## 2017-08-18 DIAGNOSIS — K219 Gastro-esophageal reflux disease without esophagitis: Secondary | ICD-10-CM | POA: Insufficient documentation

## 2017-08-31 DIAGNOSIS — B009 Herpesviral infection, unspecified: Secondary | ICD-10-CM | POA: Insufficient documentation

## 2017-08-31 DIAGNOSIS — M940 Chondrocostal junction syndrome [Tietze]: Secondary | ICD-10-CM | POA: Insufficient documentation

## 2017-08-31 DIAGNOSIS — G894 Chronic pain syndrome: Secondary | ICD-10-CM | POA: Insufficient documentation

## 2017-08-31 DIAGNOSIS — J309 Allergic rhinitis, unspecified: Secondary | ICD-10-CM | POA: Insufficient documentation

## 2017-08-31 DIAGNOSIS — F329 Major depressive disorder, single episode, unspecified: Secondary | ICD-10-CM | POA: Insufficient documentation

## 2017-08-31 DIAGNOSIS — M545 Low back pain, unspecified: Secondary | ICD-10-CM | POA: Insufficient documentation

## 2017-08-31 DIAGNOSIS — F5101 Primary insomnia: Secondary | ICD-10-CM | POA: Insufficient documentation

## 2017-09-14 DIAGNOSIS — D509 Iron deficiency anemia, unspecified: Secondary | ICD-10-CM | POA: Insufficient documentation

## 2018-03-23 ENCOUNTER — Encounter: Payer: Self-pay | Admitting: Family Medicine

## 2018-03-23 ENCOUNTER — Ambulatory Visit (INDEPENDENT_AMBULATORY_CARE_PROVIDER_SITE_OTHER): Payer: Medicare Other | Admitting: Family Medicine

## 2018-03-23 DIAGNOSIS — L280 Lichen simplex chronicus: Secondary | ICD-10-CM

## 2018-03-23 DIAGNOSIS — L732 Hidradenitis suppurativa: Secondary | ICD-10-CM | POA: Diagnosis not present

## 2018-03-23 MED ORDER — CLOBETASOL PROP EMOLLIENT BASE 0.05 % EX CREA: 1.0000 "application " | TOPICAL_CREAM | CUTANEOUS | 5 refills | Status: AC

## 2018-03-23 MED ORDER — CHLORHEXIDINE GLUCONATE 4 % EX LIQD: Freq: Every day | CUTANEOUS | Status: AC

## 2018-03-23 NOTE — Patient Instructions (Addendum)
Hidradenitis Suppurativa Hidradenitis suppurativa is a long-term (chronic) skin disease that starts with blocked sweat glands or hair follicles. Bacteria may grow in these blocked openings of your skin. Hidradenitis suppurativa is like a severe form of acne that develops in areas of your body where acne would be unusual. It is most likely to affect the areas of your body where skin rubs against skin and becomes moist. This includes your:  Underarms.  Groin.  Genital areas.  Buttocks.  Upper thighs.  Breasts.  Hidradenitis suppurativa may start out with small pimples. The pimples can develop into deep sores that break open (rupture) and drain pus. Over time your skin may thicken and become scarred. Hidradenitis suppurativa cannot be passed from person to person. What are the causes? The exact cause of hidradenitis suppurativa is not known. This condition may be due to:  Female and female hormones. The condition is rare before and after puberty.  An overactive body defense system (immune system). Your immune system may overreact to the blocked hair follicles or sweat glands and cause swelling and pus-filled sores.  What increases the risk? You may have a higher risk of hidradenitis suppurativa if you:  Are a woman.  Are between ages 11 and 55.  Have a family history of hidradenitis suppurativa.  Have a personal history of acne.  Are overweight.  Smoke.  Take the drug lithium.  What are the signs or symptoms? The first signs of an outbreak are usually painful skin bumps that look like pimples. As the condition progresses:  Skin bumps may get bigger and grow deeper into the skin.  Bumps under the skin may rupture and drain smelly pus.  Skin may become itchy and infected.  Skin may thicken and scar.  Drainage may continue through tunnels under the skin (fistulas).  Walking and moving your arms can become painful.  How is this diagnosed? Your health care provider may  diagnose hidradenitis suppurativa based on your medical history and your signs and symptoms. A physical exam will also be done. You may need to see a health care provider who specializes in skin diseases (dermatologist). You may also have tests done to confirm the diagnosis. These can include:  Swabbing a sample of pus or drainage from your skin so it can be sent to the lab and tested for infection.  Blood tests to check for infection.  How is this treated? The same treatment will not work for everybody with hidradenitis suppurativa. Your treatment will depend on how severe your symptoms are. You may need to try several treatments to find what works best for you. Part of your treatment may include cleaning and bandaging (dressing) your wounds. You may also have to take medicines, such as the following:  Antibiotics.  Acne medicines.  Medicines to block or suppress the immune system.  A diabetes medicine (metformin) is sometimes used to treat this condition.  For women, birth control pills can sometimes help relieve symptoms.  You may need surgery if you have a severe case of hidradenitis suppurativa that does not respond to medicine. Surgery may involve:  Using a laser to clear the skin and remove hair follicles.  Opening and draining deep sores.  Removing the areas of skin that are diseased and scarred.  Follow these instructions at home:  Learn as much as you can about your disease, and work closely with your health care providers.  Take medicines only as directed by your health care provider.  If you were prescribed   an antibiotic medicine, finish it all even if you start to feel better.  If you are overweight, losing weight may be very helpful. Try to reach and maintain a healthy weight.  Do not use any tobacco products, including cigarettes, chewing tobacco, or electronic cigarettes. If you need help quitting, ask your health care provider.  Do not shave the areas where you  get hidradenitis suppurativa.  Do not wear deodorant.  Wear loose-fitting clothes.  Try not to overheat and get sweaty.  Take a daily bleach bath as directed by your health care provider. ? Fill your bathtub halfway with water. ? Pour in  cup of unscented household bleach. ? Soak for 5-10 minutes.  Cover sore areas with a warm, clean washcloth (compress) for 5-10 minutes. Contact a health care provider if:  You have a flare-up of hidradenitis suppurativa.  You have chills or a fever.  You are having trouble controlling your symptoms at home. This information is not intended to replace advice given to you by your health care provider. Make sure you discuss any questions you have with your health care provider. Document Released: 04/28/2004 Document Revised: 02/20/2016 Document Reviewed: 12/15/2013 Elsevier Interactive Patient Education  [employer]  Lichen Sclerosus Lichen sclerosus is a skin problem. It can happen on any part of the body. It happens most often in the anal or genital areas. It can cause itching and discomfort. Treatment can help to control symptoms. This skin problem is not passed from one person to another (not contagious). The cause is not known. Follow these instructions at home:  Take over-the-counter and prescription medicines only as told by your doctor.  Use creams or ointments as told by your doctor.  Do not scratch the affected areas of skin.  Women should keep the vagina as clean and dry as they can.  Keep all follow-up visits as told by your doctor. This is important. Contact a doctor if:  Your redness, swelling, or pain gets worse.  You have fluid, blood, or pus coming from the area.  You have new patches (lesions) on your skin.  You have a fever.  You have pain during sex. This information is not intended to replace advice given to you by your health care provider. Make sure you discuss any questions you have with your health care  provider. Document Released: 08/27/2008 Document Revised: 02/20/2016 Document Reviewed: 12/10/2014 Elsevier Interactive Patient Education  2018 [employer]. Hidradenitis Suppurativa Hidradenitis suppurativa is a long-term (chronic) skin disease that starts with blocked sweat glands or hair follicles. Bacteria may grow in these blocked openings of your skin. Hidradenitis suppurativa is like a severe form of acne that develops in areas of your body where acne would be unusual. It is most likely to affect the areas of your body where skin rubs against skin and becomes moist. This includes your:  Underarms.  Groin.  Genital areas.  Buttocks.  Upper thighs.  Breasts.  Hidradenitis suppurativa may start out with small pimples. The pimples can develop into deep sores that break open (rupture) and drain pus. Over time your skin may thicken and become scarred. Hidradenitis suppurativa cannot be passed from person to person. What are the causes? The exact cause of hidradenitis suppurativa is not known. This condition may be due to:  Female and female hormones. The condition is rare before and after puberty.  An overactive body defense system (immune system). Your immune system may overreact to the blocked hair follicles or sweat glands and  cause swelling and pus-filled sores.  What increases the risk? You may have a higher risk of hidradenitis suppurativa if you:  Are a woman.  Are between ages 38 and 78.  Have a family history of hidradenitis suppurativa.  Have a personal history of acne.  Are overweight.  Smoke.  Take the drug lithium.  What are the signs or symptoms? The first signs of an outbreak are usually painful skin bumps that look like pimples. As the condition progresses:  Skin bumps may get bigger and grow deeper into the skin.  Bumps under the skin may rupture and drain smelly pus.  Skin may become itchy and infected.  Skin may thicken and scar.  Drainage may  continue through tunnels under the skin (fistulas).  Walking and moving your arms can become painful.  How is this diagnosed? Your health care provider may diagnose hidradenitis suppurativa based on your medical history and your signs and symptoms. A physical exam will also be done. You may need to see a health care provider who specializes in skin diseases (dermatologist). You may also have tests done to confirm the diagnosis. These can include:  Swabbing a sample of pus or drainage from your skin so it can be sent to the lab and tested for infection.  Blood tests to check for infection.  How is this treated? The same treatment will not work for everybody with hidradenitis suppurativa. Your treatment will depend on how severe your symptoms are. You may need to try several treatments to find what works best for you. Part of your treatment may include cleaning and bandaging (dressing) your wounds. You may also have to take medicines, such as the following:  Antibiotics.  Acne medicines.  Medicines to block or suppress the immune system.  A diabetes medicine (metformin) is sometimes used to treat this condition.  For women, birth control pills can sometimes help relieve symptoms.  You may need surgery if you have a severe case of hidradenitis suppurativa that does not respond to medicine. Surgery may involve:  Using a laser to clear the skin and remove hair follicles.  Opening and draining deep sores.  Removing the areas of skin that are diseased and scarred.  Follow these instructions at home:  Learn as much as you can about your disease, and work closely with your health care providers.  Take medicines only as directed by your health care provider.  If you were prescribed an antibiotic medicine, finish it all even if you start to feel better.  If you are overweight, losing weight may be very helpful. Try to reach and maintain a healthy weight.  Do not use any tobacco  products, including cigarettes, chewing tobacco, or electronic cigarettes. If you need help quitting, ask your health care provider.  Do not shave the areas where you get hidradenitis suppurativa.  Do not wear deodorant.  Wear loose-fitting clothes.  Try not to overheat and get sweaty.  Take a daily bleach bath as directed by your health care provider. ? Fill your bathtub halfway with water. ? Pour in  cup of unscented household bleach. ? Soak for 5-10 minutes.  Cover sore areas with a warm, clean washcloth (compress) for 5-10 minutes. Contact a health care provider if:  You have a flare-up of hidradenitis suppurativa.  You have chills or a fever.  You are having trouble controlling your symptoms at home. This information is not intended to replace advice given to you by your health care provider. Make sure  you discuss any questions you have with your health care provider. Document Released: 04/28/2004 Document Revised: 02/20/2016 Document Reviewed: 12/15/2013 Elsevier Interactive Patient Education  2018 [employer].

## 2018-03-23 NOTE — Progress Notes (Signed)
Pt states that she has been having an outbreak of boils on skin for a year.

## 2018-03-23 NOTE — Assessment & Plan Note (Signed)
Change to dial soap and hibiclens given.

## 2018-03-23 NOTE — Assessment & Plan Note (Signed)
Vs. Lichen sclerosis, no loss of architecture, but could be early. Treatment and diagnostic trial of Clobetasol for the vulvar irritation and burning.

## 2018-03-23 NOTE — Progress Notes (Signed)
   Subjective:    Patient ID: Laura Obrien is a 57 y.o. female presenting with bumps in vaginal area  on 03/23/2018  HPI: New patient. Seeking second opinion of vulvar issues. Notes from Dr. Posey Pronto, seen and reviewed. Notes one year history of vaginal bumps. Has needed I and D.  Notes she is hurting most of the time. Notes that she has them under the skin. They cysts burn and are irritated. Tried Cleocin gel and Lotrisone cream, not helping. Separate from bumps is the vaginal irritation and burning. Negative wet prep x 2.   Review of Systems  Constitutional: Negative for chills and fever.  Respiratory: Negative for shortness of breath.   Cardiovascular: Negative for chest pain.  Gastrointestinal: Negative for abdominal pain, nausea and vomiting.  Genitourinary: Positive for genital sores and vaginal pain. Negative for dysuria.  Skin: Negative for rash.      Objective:    BP 110/81   Pulse 76   Ht 5\' 6"  (1.676 m)   Wt 167 lb 6.4 oz (75.9 kg)   BMI 27.02 kg/m  Physical Exam  Constitutional: She is oriented to person, place, and time. She appears well-developed and well-nourished. No distress.  HENT:  Head: Normocephalic and atraumatic.  Eyes: No scleral icterus.  Neck: Neck supple.  Cardiovascular: Normal rate.  Pulmonary/Chest: Effort normal.  Abdominal: Soft.  Genitourinary:  Genitourinary Comments: Several cysts noted in various healing, plus some clogged sebaceous glands. She has vaginal atrophy, with some pallor.  Neurological: She is alert and oriented to person, place, and time.  Skin: Skin is warm and dry.  Psychiatric: She has a normal mood and affect.        Assessment & Plan:   Problem List Items Addressed This Visit      Unprioritized   Lichen simplex chronicus    Vs. Lichen sclerosis, no loss of architecture, but could be early. Treatment and diagnostic trial of Clobetasol for the vulvar irritation and burning.      Hidradenitis    Change to dial soap  and hibiclens given.         Total face-to-face time with patient: 20 minutes. Over 50% of encounter was spent on counseling and coordination of care. Return in about 3 months (around 06/23/2018).  Donnamae Jude 03/23/2018 2:03 PM

## 2018-04-25 ENCOUNTER — Ambulatory Visit (INDEPENDENT_AMBULATORY_CARE_PROVIDER_SITE_OTHER): Payer: Medicare Other | Admitting: Obstetrics & Gynecology

## 2018-04-25 ENCOUNTER — Encounter: Payer: Self-pay | Admitting: Obstetrics & Gynecology

## 2018-04-25 VITALS — BP 117/77 | HR 79 | Ht 66.0 in | Wt 166.0 lb

## 2018-04-25 DIAGNOSIS — L732 Hidradenitis suppurativa: Secondary | ICD-10-CM

## 2018-04-25 MED ORDER — DOXYCYCLINE HYCLATE 100 MG PO CAPS: 100.0000 mg | ORAL_CAPSULE | Freq: Two times a day (BID) | ORAL | 2 refills | Status: DC

## 2018-04-25 NOTE — Progress Notes (Signed)
History:  57 y.o. G0P0 here today for reeval of pustular lesions on vulva. Pt reports that she has been using the Clobetasol cream  With no imporvement. She reports that the 'cysts' are getting larger and are more painful. She has been using the Dial soap 2x/day. Pt reports that she does shave the vulvar area but, not often.  The following portions of the patient's history were reviewed and updated as appropriate: allergies, current medications, past family history, past medical history, past social history, past surgical history and problem list.  Review of Systems:  Pertinent items are noted in HPI.    Objective:  Physical Exam Blood pressure 117/77, pulse 79, height 5\' 6"  (1.676 m), weight 166 lb 0.6 oz (75.3 kg).  CONSTITUTIONAL: Well-developed, well-nourished female in no acute distress.  HENT:  Normocephalic, atraumatic EYES: Conjunctivae and EOM are normal. No scleral icterus.  NECK: Normal range of motion SKIN: Skin is warm and dry. No rash noted. Not diaphoretic.No pallor. [place]: Alert and oriented to person, place, and time. Normal coordination.  Pelvic: the vilvar area has multiple pustualr lesions, mainly on the mons pubis. The vaginal mucosa and cervix are WNL.  Normal discharge.     Assessment & Plan:  hidradenitis suppurative- mild. Not responsive to antibacterial soap  Doxycycline 100mg  bid  F/u in 4 weeks or sooner prn Keep the antibacterial soap regimen.   Roch Quach L. Harraway-Smith, M.D., Cherlynn June

## 2018-05-16 ENCOUNTER — Encounter (HOSPITAL_BASED_OUTPATIENT_CLINIC_OR_DEPARTMENT_OTHER): Payer: Self-pay | Admitting: *Deleted

## 2018-05-16 ENCOUNTER — Other Ambulatory Visit: Payer: Self-pay

## 2018-05-16 ENCOUNTER — Emergency Department (HOSPITAL_BASED_OUTPATIENT_CLINIC_OR_DEPARTMENT_OTHER)
Admission: EM | Admit: 2018-05-16 | Discharge: 2018-05-16 | Disposition: A | Discharge status: Home / Self Care | Payer: Medicare Other | Attending: Emergency Medicine | Admitting: Emergency Medicine

## 2018-05-16 DIAGNOSIS — N3000 Acute cystitis without hematuria: Secondary | ICD-10-CM

## 2018-05-16 DIAGNOSIS — E119 Type 2 diabetes mellitus without complications: Secondary | ICD-10-CM | POA: Insufficient documentation

## 2018-05-16 DIAGNOSIS — R030 Elevated blood-pressure reading, without diagnosis of hypertension: Secondary | ICD-10-CM | POA: Diagnosis present

## 2018-05-16 DIAGNOSIS — N3090 Cystitis, unspecified without hematuria: Secondary | ICD-10-CM | POA: Insufficient documentation

## 2018-05-16 LAB — COMPREHENSIVE METABOLIC PANEL
ALBUMIN: 4.2 g/dL (ref 3.5–5.0)
ALT: 16 U/L (ref 0–44)
ANION GAP: 7 (ref 5–15)
AST: 24 U/L (ref 15–41)
Alkaline Phosphatase: 89 U/L (ref 38–126)
BILIRUBIN TOTAL: 0.6 mg/dL (ref 0.3–1.2)
BUN: 13 mg/dL (ref 6–20)
CHLORIDE: 107 mmol/L (ref 98–111)
CO2: 29 mmol/L (ref 22–32)
Calcium: 9.2 mg/dL (ref 8.9–10.3)
Creatinine, Ser: 0.75 mg/dL (ref 0.44–1.00)
GFR calc Af Amer: >60 mL/min (ref >60)
GFR calc non Af Amer: >60 mL/min (ref >60)
GLUCOSE: 106 mg/dL — AB (ref 70–99)
Potassium: 3.7 mmol/L (ref 3.5–5.1)
Sodium: 143 mmol/L (ref 135–145)
TOTAL PROTEIN: 7.9 g/dL (ref 6.5–8.1)

## 2018-05-16 LAB — CBC WITH DIFFERENTIAL/PLATELET
BASOS ABS: 0 10*3/uL (ref 0.0–0.1)
BASOS PCT: 1 %
EOS ABS: 0 10*3/uL (ref 0.0–0.7)
Eosinophils Relative: 1 %
HEMATOCRIT: 39.5 % (ref 36.0–46.0)
Hemoglobin: 13.3 g/dL (ref 12.0–15.0)
Lymphocytes Relative: 29 %
Lymphs Abs: 1.3 10*3/uL (ref 0.7–4.0)
MCH: 25.6 pg — ABNORMAL LOW (ref 26.0–34.0)
MCHC: 33.7 g/dL (ref 30.0–36.0)
MCV: 76.1 fL — ABNORMAL LOW (ref 78.0–100.0)
MONO ABS: 0.3 10*3/uL (ref 0.1–1.0)
MONOS PCT: 7 %
Neutro Abs: 2.7 10*3/uL (ref 1.7–7.7)
Neutrophils Relative %: 62 %
PLATELETS: 294 10*3/uL (ref 150–400)
RBC: 5.19 MIL/uL — ABNORMAL HIGH (ref 3.87–5.11)
RDW: 16 % — AB (ref 11.5–15.5)
WBC: 4.4 10*3/uL (ref 4.0–10.5)

## 2018-05-16 LAB — URINALYSIS, MICROSCOPIC (REFLEX)

## 2018-05-16 LAB — URINALYSIS, ROUTINE W REFLEX MICROSCOPIC
Bilirubin Urine: NEGATIVE
GLUCOSE, UA: NEGATIVE mg/dL
Hgb urine dipstick: NEGATIVE
Ketones, ur: NEGATIVE mg/dL
Nitrite: NEGATIVE
PROTEIN: NEGATIVE mg/dL
SPECIFIC GRAVITY, URINE: 1.015 (ref 1.005–1.030)
pH: 7 (ref 5.0–8.0)

## 2018-05-16 LAB — LIPASE, BLOOD: LIPASE: 37 U/L (ref 11–51)

## 2018-05-16 MED ORDER — CEPHALEXIN 500 MG PO CAPS: 500.0000 mg | ORAL_CAPSULE | Freq: Two times a day (BID) | ORAL | 0 refills | Status: AC

## 2018-05-16 MED ORDER — MECLIZINE HCL 25 MG PO TABS
25.0000 mg | ORAL_TABLET | Freq: Once | ORAL | Status: AC
  Administered 2018-05-16: 25 mg via ORAL
  Filled 2018-05-16: qty 1

## 2018-05-16 NOTE — ED Triage Notes (Signed)
Pt c/o increased bp and c/o nausea x 1 day

## 2018-05-16 NOTE — Discharge Instructions (Signed)
Thank you for allowing me to care for you today in the Emergency Department.   Take 1 tablet of Keflex 2 times daily for the next 7 days.  This is an antibiotic to treat your urinary tract infection.  Please keep your follow-up appointment with your primary care provider.  Return to the emergency department if you develop slurred speech, facial drooping, weakness on one side of your body or one arm or leg, changes in her vision such as double vision, or other new, concerning symptoms.

## 2018-05-16 NOTE — ED Provider Notes (Signed)
[facility] EMERGENCY DEPARTMENT Provider Note   CSN: [id number] Arrival date & time: 05/16/18  1357     History   Chief Complaint Chief Complaint  Patient presents with  . Hypertension    HPI Laura Obrien is a 57 y.o. female with a history of DM Type II, Tieze's syndrome, HSV, pancreatitis, uterine fibroids, fibromyalgia, and lichen simplex chronicus presents to the emergency department with a chief complaint of dizziness.  The patient reports sudden onset dizziness that lasted constantly for 2 to 3 hours this morning.  No history of similar.  She reports that she slept for 14 hours last night when she typically only sleeps 6 to 7 hours.  When she awoke, she reports that she was feeling nauseated, generally weak, and fatigued so she went to the bathroom and crawled back in bed.    She reports that when she woke later that she was feeling dizzy and continued to feel weak and fatigued so she checked her blood pressure. She reports that she initially got a reading of 138/90 and when she rechecked at 10 to 20 minutes later got a reading of 147/90.  She then rechecked it and got a reading of 138/80.  She states that she has never checked her blood pressure before and was concerned it was high so she came to the ED.  She denies headache, visual changes, fever, chills, abdominal pain, nausea, vomiting, tinnitus, otalgia, neck pain or stiffness, or chest pain, or dyspnea.  The history is provided by the patient. No language interpreter was used.    Past Medical History:  Diagnosis Date  . Fibroid, uterine   . Fibromyalgia     Patient Active Problem List   Diagnosis Date Noted  . Lichen simplex chronicus 03/23/2018  . Hidradenitis 03/23/2018  . Iron deficiency anemia 09/14/2017  . Allergic rhinitis 08/31/2017  . Chronic pain syndrome 08/31/2017  . Herpes simplex type 2 infection 08/31/2017  . Major depressive disorder 08/31/2017  . Primary insomnia 08/31/2017  .  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 Low back pain 08/31/2017  . GERD (gastroesophageal reflux disease) 08/18/2017  . Hyperlipidemia 08/18/2017  . History of acute pancreatitis 06/22/2013  . Breast lump on right side at 11 o'clock position 01/20/2013    Past Surgical History:  Procedure Laterality Date  . ABDOMINAL HYSTERECTOMY    . BREAST SURGERY     benign mass removed/ left breast     OB History    Gravida  0   Para      Term      Preterm      AB      Living        SAB      TAB      Ectopic      Multiple      Live Births               Home Medications    Prior to Admission medications   Medication Sig Start Date End Date Taking? Authorizing Provider  cephALEXin (KEFLEX) 500 MG capsule Take 1 capsule (500 mg total) by mouth 2 (two) times daily for 7 days. 05/16/18 05/23/18  Tyauna Lacaze A, PA-C  Clobetasol Prop Emollient Base (CLOBETASOL PROPIONATE E) 0.05 % emollient cream Apply 1 application topically 1 day or 1 dose. Use q hs daily x 6 wks, then go to 2x/wk 03/23/18   Donnamae Jude, MD  doxycycline (VIBRAMYCIN) 100 MG capsule Take 1 capsule (100 mg total)  by mouth 2 (two) times daily. 04/25/18   Lavonia Drafts, MD  HYDROcodone-acetaminophen (NORCO/VICODIN) 5-325 MG tablet Take 1-2 tablets by mouth every 6 hours as needed for pain and/or cough. 11/02/16   Pisciotta, Elmyra Ricks, PA-C  Milnacipran (SAVELLA) 50 MG TABS tablet Take 50 mg by mouth 2 (two) times daily.    [provider]  oxyCODONE-acetaminophen (PERCOCET/ROXICET) 5-325 MG per tablet Take 1 tablet by mouth every 6 (six) hours as needed for pain. 11/15/12   Blanchie Dessert, MD  promethazine (PHENERGAN) 25 MG tablet Take 1 tablet (25 mg total) by mouth every 6 (six) hours as needed for nausea or vomiting. Patient not taking: Reported on 04/25/2018 11/02/16   Pisciotta, Elmyra Ricks, PA-C  traMADol (ULTRAM) 50 MG tablet Take by mouth every 6 (six) hours as needed.    [provider]     Family History Family History  Problem Relation Age of Onset  . Cancer Mother        breast  . Hypertension Mother   . Cancer Maternal Grandmother        lung  . Hypertension Father   . Hypertension Sister   . Hypertension Brother     Social History Social History   Tobacco Use  . Smoking status: Never Smoker  . Smokeless tobacco: Never Used  Substance Use Topics  . Alcohol use: No  . Drug use: No     Allergies   Patient has no known allergies.   Review of Systems Review of Systems  Constitutional: Positive for fatigue. Negative for activity change, chills and fever.  HENT: Negative for congestion and sore throat.   Respiratory: Negative for shortness of breath and wheezing.   Cardiovascular: Negative for chest pain and palpitations.  Gastrointestinal: Positive for nausea. Negative for abdominal pain, diarrhea and vomiting.  Endocrine: Positive for polydipsia and polyuria.  Genitourinary: Positive for frequency. Negative for dysuria, urgency and vaginal pain.  Musculoskeletal: Negative for back pain, neck pain and neck stiffness.  Skin: Negative for rash.  Allergic/Immunologic: Negative for immunocompromised state.  Neurological: Positive for dizziness and weakness (generalized). Negative for syncope, light-headedness, numbness and headaches.  Psychiatric/Behavioral: Negative for confusion.     Physical Exam Updated Vital Signs BP 139/84 (BP Location: Right Arm)   Pulse 75   Temp 98.2 F (36.8 C)   Resp 18   Ht 5\' 6"  (1.676 m)   Wt 75.3 kg   SpO2 99%   BMI 26.79 kg/m   Physical Exam  Constitutional: She is oriented to person, place, and time. No distress.  HENT:  Head: Normocephalic.  Right Ear: External ear normal. A middle ear effusion is present.  Left Ear: External ear normal. A middle ear effusion is present.  Eyes: Pupils are equal, round, and reactive to light. Conjunctivae and EOM are normal.  Neck: Neck supple.  Cardiovascular: Normal  rate, regular rhythm, normal heart sounds and intact distal pulses. Exam reveals no gallop and no friction rub.  No murmur heard. Pulmonary/Chest: Effort normal and breath sounds normal. No stridor. No respiratory distress. She has no wheezes. She has no rales. She exhibits no tenderness.  Abdominal: Soft. Bowel sounds are normal. She exhibits no distension and no mass. There is no tenderness. There is no rebound and no guarding. No hernia.  Neurological: She is alert and oriented to person, place, and time.  Cranial nerves II through XII are grossly intact.  Alert and oriented x3.  Moves all extremities.  GCS 15.  5 out of  5 strength against resistance of the large muscle groups of the bilateral upper and lower extremities.  Sensation is intact and symmetric throughout.  No dysmetria with finger-to-nose bilaterally.  Mild ataxia with Romberg.  No pronator drift.  Mild gait instability with tandem gait, heel walking, and toe walking.  Skin: Skin is warm. No rash noted.  Psychiatric: Her behavior is normal.  Nursing note and vitals reviewed.    ED Treatments / Results  Labs (all labs ordered are listed, but only abnormal results are displayed) Labs Reviewed  CBC WITH DIFFERENTIAL/PLATELET - Abnormal; Notable for the following components:      Result Value   RBC 5.19 (*)    MCV 76.1 (*)    MCH 25.6 (*)    RDW 16.0 (*)    All other components within normal limits  COMPREHENSIVE METABOLIC PANEL - Abnormal; Notable for the following components:   Glucose, Bld 106 (*)    All other components within normal limits  URINALYSIS, ROUTINE W REFLEX MICROSCOPIC - Abnormal; Notable for the following components:   APPearance CLOUDY (*)    Leukocytes, UA MODERATE (*)    All other components within normal limits  URINALYSIS, MICROSCOPIC (REFLEX) - Abnormal; Notable for the following components:   Bacteria, UA FEW (*)    All other components within normal limits  URINE CULTURE  LIPASE, BLOOD     EKG None  Radiology No results found.  Procedures Procedures (including critical care time)  Medications Ordered in ED Medications  meclizine (ANTIVERT) tablet 25 mg (25 mg Oral Given 05/16/18 1605)     Initial Impression / Assessment and Plan / ED Course  I have reviewed the triage vital signs and the nursing notes.  Pertinent labs & imaging results that were available during my care of the patient were reviewed by me and considered in my medical decision making (see chart for details).     57 year old female with a history of DM Type II, Tieze's syndrome, HSV, pancreatitis, uterine fibroids, fibromyalgia, and lichen simplex chronicus presenting with dizziness, generalized weakness, fatigue, and urinary frequency.  The patient has a history of diabetes, but after reviewing her medical record it sounds if she has not accepted this diagnosis.  She has not been checking her blood sugar at home.  Question urinary frequency versus polyuria.  Basic labs ordered.  On exam, the patient has some ataxia initially with Romberg.  She also has gait instability with tandem gait, heel walking, and toe walking.  The patient is a poor historian and is unable to tell if her gait feels different, but she does endorse feeling more off balance.  She states that she continues to have intermittent dizziness.  Doubt BPPV given the length of her dizziness.  Question central versus peripheral vertigo.  Meclizine given for dizziness.  The patient was discussed with Dr. Sherry Ruffing, attending physician.  Labs are notable for urinalysis with positive leukocyte esterase and pyuria.  Urine culture sent.  Suspect the patient's urinary frequency and generalized weakness may be secondary to UTI. On initial reevaluation, the patient continued to endorse dizziness and continued to have some instability with gait on reevaluation.  Discussed the patient with Dr. Cheral Marker, neurology, who recommended MRI since exam is unable to  rule out CVA.  On reevaluation, ataxia with Romberg had resolved.  The patient's dizziness had also resolved.  The patient also had improved gait with tandem gait, heel walking, and toe walking.  I had a lengthy shared decision making conversation  with the patient and her friend who accompanied her to her to the ED.  Discussed that at this time with her gait change that we were unable to rule out a stroke and an MRI would be required to fully rule it out.  The patient declined a transfer to another facility for MRI.  She preferred to be discharged home and watch and wait if her symptoms worsened.  The patient acknowledged that a CVA could not be ruled out from this time.  All questions were answered.  Keflex given for UTI and urine culture sent.  Strict return precautions given.  Patient is hemodynamically stable and in no acute distress.  She is safe for discharge home with outpatient follow-up.  Final Clinical Impressions(s) / ED Diagnoses   Final diagnoses:  Acute cystitis without hematuria    ED Discharge Orders         Ordered    cephALEXin (KEFLEX) 500 MG capsule  2 times daily     05/16/18 1939           McDonaldLaymond Purser, PA-C 05/16/18 2218    Tegeler, Gwenyth Allegra, MD 05/17/18 0002

## 2018-05-16 NOTE — ED Notes (Signed)
Pt reports waking up tired this AM and feeling like she kept wanting to lay down. Pt called PCP and checked her pressure- pt reports 130/90. Pt was told to be seen

## 2018-05-18 LAB — URINE CULTURE

## 2018-05-23 ENCOUNTER — Encounter: Payer: Self-pay | Admitting: Obstetrics & Gynecology

## 2018-05-23 ENCOUNTER — Ambulatory Visit (INDEPENDENT_AMBULATORY_CARE_PROVIDER_SITE_OTHER): Payer: Medicare Other | Admitting: Obstetrics & Gynecology

## 2018-05-23 VITALS — BP 126/82 | HR 65 | Ht 66.0 in | Wt 167.0 lb

## 2018-05-23 DIAGNOSIS — L732 Hidradenitis suppurativa: Secondary | ICD-10-CM | POA: Diagnosis not present

## 2018-05-23 NOTE — Progress Notes (Signed)
History:  57 y.o. G0P0 here today for f/u of hydradenitis suppurative. Pt completed a course of Doxycycline. She is worried that it's 'still there.' She denies new lesions. She still washes with Dove or Dial soap. She is worried that this is an STI or that she did something wrong to cause this condition.    The following portions of the patient's history were reviewed and updated as appropriate: allergies, current medications, past family history, past medical history, past social history, past surgical history and problem list.  Review of Systems:  Pertinent items are noted in HPI.    Objective:  Physical Exam Blood pressure 126/82, pulse 65, height 5\' 6"  (1.676 m), weight 167 lb (75.8 kg).  CONSTITUTIONAL: Well-developed, well-nourished female in no acute distress.  HENT:  Normocephalic, atraumatic EYES: Conjunctivae and EOM are normal. No scleral icterus.  NECK: Normal range of motion SKIN: Skin is warm and dry. No rash noted. Not diaphoretic.No pallor. [place]: Alert and oriented to person, place, and time. Normal coordination.   Pelvic: Normal appearing external genitalia; there are no pustules or lesion noted. The skin shows scarring from prev lesion but, there is no active disease.   Assessment & Plan:  Hydradenitis suppurativa.   cond is stable and quiescent at present   Wash mons with Dial soap daily  Reviewed cond and maintenance  F/u prn  Mckala Pantaleon L. Harraway-Smith, M.D., Cherlynn June

## 2018-09-07 ENCOUNTER — Ambulatory Visit: Payer: Medicare Other | Admitting: Obstetrics & Gynecology

## 2018-10-03 ENCOUNTER — Ambulatory Visit: Payer: Medicare Other | Admitting: Obstetrics & Gynecology

## 2018-10-10 ENCOUNTER — Other Ambulatory Visit (HOSPITAL_COMMUNITY)
Admission: RE | Admit: 2018-10-10 | Discharge: 2018-10-10 | Disposition: A | Discharge status: Home / Self Care | Payer: Medicare Other | Source: Ambulatory Visit | Attending: Obstetrics & Gynecology | Admitting: Obstetrics & Gynecology

## 2018-10-10 ENCOUNTER — Encounter: Payer: Self-pay | Admitting: Obstetrics & Gynecology

## 2018-10-10 ENCOUNTER — Ambulatory Visit (INDEPENDENT_AMBULATORY_CARE_PROVIDER_SITE_OTHER): Payer: Medicare Other | Admitting: Obstetrics & Gynecology

## 2018-10-10 VITALS — BP 102/76 | HR 78 | Wt 162.0 lb

## 2018-10-10 DIAGNOSIS — N898 Other specified noninflammatory disorders of vagina: Secondary | ICD-10-CM | POA: Insufficient documentation

## 2018-10-10 DIAGNOSIS — B9689 Other specified bacterial agents as the cause of diseases classified elsewhere: Secondary | ICD-10-CM | POA: Diagnosis present

## 2018-10-10 DIAGNOSIS — N760 Acute vaginitis: Secondary | ICD-10-CM | POA: Diagnosis not present

## 2018-10-10 MED ORDER — METRONIDAZOLE 500 MG PO TABS: 500.0000 mg | ORAL_TABLET | Freq: Two times a day (BID) | ORAL | 0 refills | Status: DC

## 2018-10-10 NOTE — Progress Notes (Signed)
Patient complaining of vaginal odor "for awhile now". [name] RN

## 2018-10-10 NOTE — Patient Instructions (Signed)
The hair stuff is called [place]. You can get it at a beauty supply or online.

## 2018-10-10 NOTE — Progress Notes (Signed)
History:  58 y.o. G0P0 here today for vaginal odor.  The odor smells really bad and has been present for 2 weeks. She reports that she is not sexually active. She reports that her hydradenitis suppurative has been well controlled.    The following portions of the patient's history were reviewed and updated as appropriate: allergies, current medications, past family history, past medical history, past social history, past surgical history and problem list.  Review of Systems:  Pertinent items are noted in HPI.    Objective:  Physical Exam Blood pressure 102/76, pulse 78, weight 162 lb (73.5 kg).  CONSTITUTIONAL: Well-developed, well-nourished female in no acute distress.  HENT:  Normocephalic, atraumatic EYES: Conjunctivae and EOM are normal. No scleral icterus.  NECK: Normal range of motion SKIN: Skin is warm and dry. No rash noted. Not diaphoretic.No pallor. [place]: Alert and oriented to person, place, and time. Normal coordination.  Abd: Soft, nontender and nondistended Pelvic: Normal appearing external genitalia; normal appearing vaginal mucosa and cervix.  Normal discharge.  No odor noted.    Assessment & Plan:   vaginal odor  Flaggyl 500mg  po bid x 7 days  F/u prn  Change ot dove unscented soap as HS is improved.   Total face-to-face time with patient was 15 min.  Greater than 50% was spent in counseling and coordination of care with the patient.     Leonetta Mcgivern L. Harraway-Smith, M.D., Cherlynn June

## 2018-10-11 LAB — CERVICOVAGINAL ANCILLARY ONLY
BACTERIAL VAGINITIS: NEGATIVE
CANDIDA VAGINITIS: NEGATIVE

## 2018-10-13 ENCOUNTER — Telehealth: Payer: Self-pay

## 2018-10-13 NOTE — Telephone Encounter (Signed)
Patient voicemail not setup.

## 2018-10-19 ENCOUNTER — Encounter: Payer: Self-pay | Admitting: Medical

## 2018-10-19 ENCOUNTER — Other Ambulatory Visit: Payer: Self-pay | Admitting: Medical

## 2018-10-19 ENCOUNTER — Ambulatory Visit (INDEPENDENT_AMBULATORY_CARE_PROVIDER_SITE_OTHER): Payer: Medicare Other | Admitting: Medical

## 2018-10-19 VITALS — BP 131/82 | HR 77 | Temp 97.7°F | Resp 16 | Ht 63.0 in | Wt 170.2 lb

## 2018-10-19 DIAGNOSIS — M797 Fibromyalgia: Secondary | ICD-10-CM | POA: Diagnosis not present

## 2018-10-19 DIAGNOSIS — R739 Hyperglycemia, unspecified: Secondary | ICD-10-CM | POA: Diagnosis not present

## 2018-10-19 DIAGNOSIS — G4700 Insomnia, unspecified: Secondary | ICD-10-CM

## 2018-10-19 DIAGNOSIS — E785 Hyperlipidemia, unspecified: Secondary | ICD-10-CM

## 2018-10-19 DIAGNOSIS — J40 Bronchitis, not specified as acute or chronic: Secondary | ICD-10-CM

## 2018-10-19 LAB — LIPID PANEL
CHOL/HDL RATIO: 3
Cholesterol: 165 mg/dL (ref 0–200)
HDL: 60.9 mg/dL (ref >39.00)
LDL Cholesterol: 95 mg/dL (ref 0–99)
NonHDL: 104.49
TRIGLYCERIDES: 49 mg/dL (ref 0.0–149.0)
VLDL: 9.8 mg/dL (ref 0.0–40.0)

## 2018-10-19 LAB — COMPREHENSIVE METABOLIC PANEL
ALT: 22 U/L (ref 0–35)
AST: 18 U/L (ref 0–37)
Albumin: 4.1 g/dL (ref 3.5–5.2)
Alkaline Phosphatase: 79 U/L (ref 39–117)
BILIRUBIN TOTAL: 0.4 mg/dL (ref 0.2–1.2)
BUN: 10 mg/dL (ref 6–23)
CALCIUM: 9.2 mg/dL (ref 8.4–10.5)
CO2: 33 meq/L — AB (ref 19–32)
Chloride: 105 mEq/L (ref 96–112)
Creatinine, Ser: 0.74 mg/dL (ref 0.40–1.20)
GFR: 98.29 mL/min (ref >60.00)
GLUCOSE: 91 mg/dL (ref 70–99)
POTASSIUM: 3.5 meq/L (ref 3.5–5.1)
Sodium: 143 mEq/L (ref 135–145)
Total Protein: 7.1 g/dL (ref 6.0–8.3)

## 2018-10-19 LAB — HEMOGLOBIN A1C: Hgb A1c MFr Bld: 6.5 % (ref 4.6–6.5)

## 2018-10-19 MED ORDER — BENZONATATE 100 MG PO CAPS: 100.0000 mg | ORAL_CAPSULE | Freq: Three times a day (TID) | ORAL | 0 refills | Status: DC | PRN

## 2018-10-19 MED ORDER — MILNACIPRAN HCL 50 MG PO TABS: 50.0000 mg | ORAL_TABLET | Freq: Two times a day (BID) | ORAL | 0 refills | Status: AC

## 2018-10-19 MED ORDER — FLUTICASONE PROPIONATE 50 MCG/ACT NA SUSP: 2.0000 | Freq: Every day | NASAL | 0 refills | Status: DC

## 2018-10-19 MED ORDER — AZITHROMYCIN 250 MG PO TABS: ORAL_TABLET | ORAL | 0 refills | Status: DC

## 2018-10-19 NOTE — Addendum Note (Signed)
Addended by: Anabel Halon on: 10/19/2018 04:37 PM   Modules accepted: Orders

## 2018-10-19 NOTE — Patient Instructions (Signed)
You appear to have bronchitis. Rest hydrate and tylenol for fever. I am prescribing cough medicine benzonatate, and a azithromycin antibiotic. For your nasal congestion Rx Flonase  You should gradually get better. If not then notify us and would recommend a chest xray.  You for history of fibromyalgia over the past 5 yrs. I am refilling prescription of Savella but also asked that you sign release of information forms will get prior records.  For insomnia continue trazodone.  For history of depression, continue with counseling.  If your mood worsens please let me know.  For high cholesterol, will get metabolic panel and lipid panel today.  For history of elevated blood sugar, will get A1c/check 17-month blood sugar average.  Follow up in 10 days or as needed

## 2018-10-19 NOTE — Progress Notes (Signed)
Subjective:    Patient ID: Laura Obrien, female    DOB: 02-27-1961, 58 y.o.   MRN: [id number]  HPI   Pt in for first time.  Pt states disabled. For year and half disability for her fibromyalgia.  Pt states history of some depression. She talks to counselor through mental health. No medications given.  She states former pcp was bit rude per pt.(Pt describes that her recent pcp did not want to rx savella).   Pt has history of fibromyalgia She has been on savella for about 5 years. It did help. [facility]. Pt not aware that it is not covered. Pt describes hx of diffuse body pains. She describes work up was negative.   Pt states recent has been having a cough. She has some pnd. Some productive cough for about a week. [facility] told her she reflux. But she does not report heart burn type symptoms. She also states nasal congestion.  Pt also has high cholesterol. She is on atorvastatin.  Pt has history of insomnia and takes trazadone.   Pt states told hx of mild high sugar. But not diabetes. Pt in past checked sugars at home. Some readings up to 124. Most lower.   Pt declines flu vaccine.     Review of Systems  Constitutional: Negative for chills, fatigue and fever.  Respiratory: Negative for cough, chest tightness, shortness of breath and wheezing.   Cardiovascular: Negative for chest pain and palpitations.  Gastrointestinal: Negative for abdominal pain.  Genitourinary: Negative for difficulty urinating and dysuria.  Musculoskeletal: Negative for back pain, joint swelling, myalgias and neck stiffness.       Hx of fibromyalgia. Controlled with savella per pt report. On med for 5 years.  Pt states no current pain but still on savella.  Skin: Negative for rash.  Neurological: Negative for dizziness, facial asymmetry and headaches.  Hematological: Negative for adenopathy. Does not bruise/bleed easily.    Psychiatric/Behavioral: Positive for sleep disturbance. Negative for decreased concentration, dysphoric mood and suicidal ideas. The patient is not nervous/anxious.     Past Medical History:  Diagnosis Date  . Diabetes mellitus without complication ([place])   . Fibroid, uterine   . Fibromyalgia   . Hypertension      Social History   Socioeconomic History  . Marital status: Single    Spouse name: Not on file  . Number of children: Not on file  . Years of education: Not on file  . Highest education level: Not on file  Occupational History  . Not on file  Social Needs  . Financial resource strain: Not on file  . Food insecurity:    Worry: Not on file    Inability: Not on file  . Transportation needs:    Medical: Not on file    Non-medical: Not on file  Tobacco Use  . Smoking status: Never Smoker  . Smokeless tobacco: Never Used  Substance and Sexual Activity  . Alcohol use: No  . Drug use: No  . Sexual activity: Not on file  Lifestyle  . Physical activity:    Days per week: Not on file    Minutes per session: Not on file  . Stress: Not on file  Relationships  . Social connections:    Talks on phone: Not on file    Gets together: Not on file    Attends religious service: Not on file  Active member of club or organization: Not on file    Attends meetings of clubs or organizations: Not on file    Relationship status: Not on file  . Intimate partner violence:    Fear of current or ex partner: Not on file    Emotionally abused: Not on file    Physically abused: Not on file    Forced sexual activity: Not on file  Other Topics Concern  . Not on file  Social History Narrative  . Not on file    Past Surgical History:  Procedure Laterality Date  . ABDOMINAL HYSTERECTOMY    . BREAST SURGERY     benign mass removed/ left breast    Family History  Problem Relation Age of Onset  . Cancer Mother        breast  . Hypertension Mother   . Cancer Maternal  Grandmother        lung  . Hypertension Father   . Hypertension Sister   . Hypertension Brother     No Known Allergies  Current Outpatient Medications on File Prior to Visit  Medication Sig Dispense Refill  . Blood Glucose Monitoring Suppl (GLUCOCOM BLOOD GLUCOSE MONITOR) DEVI Use daily to check blood sugar.    . Clobetasol Prop Emollient Base (CLOBETASOL PROPIONATE E) 0.05 % emollient cream Apply 1 application topically 1 day or 1 dose. Use q hs daily x 6 wks, then go to 2x/wk 30 g 5  . Milnacipran (SAVELLA) 50 MG TABS tablet Take 50 mg by mouth 2 (two) times daily.     Current Facility-Administered Medications on File Prior to Visit  Medication Dose Route Frequency Provider Last Rate Last Dose  . chlorhexidine (HIBICLENS) 4 % liquid   Topical Daily Donnamae Jude, MD        BP 131/82   Pulse 77   Temp 97.7 F (36.5 C) (Oral)   Resp 16   Ht 5\' 3"  (1.6 m)   Wt 170 lb 3.2 oz (77.2 kg)   SpO2 100%   BMI 30.15 kg/m       Objective:   Physical Exam  General  Mental Status - Alert. General Appearance - Well groomed. Not in acute distress.  Skin Rashes- No Rashes.  HEENT Head- Normal. Ear Auditory Canal - Left- Normal. Right - Normal.Tympanic Membrane- Left- Normal. Right- Normal. Eye Sclera/Conjunctiva- Left- Normal. Right- Normal. Nose & Sinuses Nasal Mucosa- Left-  Boggy and Congested. Right-  Boggy and  Congested.Bilateral maxillary and frontal sinus pressure. Mouth & Throat Lips: Upper Lip- Normal: no dryness, cracking, pallor, cyanosis, or vesicular eruption. Lower Lip-Normal: no dryness, cracking, pallor, cyanosis or vesicular eruption. Buccal Mucosa- Bilateral- No Aphthous ulcers. Oropharynx- No Discharge or Erythema. Tonsils: Characteristics- Bilateral- No Erythema or Congestion. Size/Enlargement- Bilateral- No enlargement. Discharge- bilateral-None.  Neck Neck- Supple. No Masses.   Chest and Lung Exam Auscultation: Breath Sounds:-Clear even and  unlabored.  Cardiovascular Auscultation:Rythm- Regular, rate and rhythm. Murmurs & Other Heart Sounds:Ausculatation of the heart reveal- No Murmurs.  Lymphatic Head & Neck General Head & Neck Lymphatics: Bilateral: Description- No Localized lymphadenopathy.       Assessment & Plan:  You appear to have bronchitis. Rest hydrate and tylenol for fever. I am prescribing cough medicine benzonatate, and a azithromycin antibiotic. For your nasal congestion Rx Flonase  You should gradually get better. If not then notify us and would recommend a chest xray.  You for history of fibromyalgia over the past 5 yrs. I am refilling  prescription of Savella but also asked that you sign release of information forms will get prior records.  For insomnia continue trazodone.  For history of depression, continue with counseling.  If your mood worsens please let me know.  For high cholesterol, will get metabolic panel and lipid panel today.  For history of elevated blood sugar, will get A1c/check 68-month blood sugar average.  Follow up in 10 days or as needed

## 2018-10-31 ENCOUNTER — Telehealth: Payer: Self-pay | Admitting: Medical

## 2018-11-01 ENCOUNTER — Encounter: Payer: Self-pay | Admitting: Family

## 2018-11-01 ENCOUNTER — Ambulatory Visit (HOSPITAL_BASED_OUTPATIENT_CLINIC_OR_DEPARTMENT_OTHER)
Admission: RE | Admit: 2018-11-01 | Discharge: 2018-11-01 | Disposition: A | Discharge status: Home / Self Care | Payer: Medicare Other | Source: Ambulatory Visit | Attending: Family | Admitting: Family

## 2018-11-01 ENCOUNTER — Ambulatory Visit (INDEPENDENT_AMBULATORY_CARE_PROVIDER_SITE_OTHER): Payer: Medicare Other | Admitting: Family

## 2018-11-01 VITALS — BP 136/89 | HR 66 | Temp 97.9°F | Resp 16 | Ht 63.0 in | Wt 170.0 lb

## 2018-11-01 DIAGNOSIS — R05 Cough: Secondary | ICD-10-CM

## 2018-11-01 DIAGNOSIS — R059 Cough, unspecified: Secondary | ICD-10-CM

## 2018-11-01 DIAGNOSIS — R32 Unspecified urinary incontinence: Secondary | ICD-10-CM

## 2018-11-01 DIAGNOSIS — R29898 Other symptoms and signs involving the musculoskeletal system: Secondary | ICD-10-CM

## 2018-11-01 DIAGNOSIS — J40 Bronchitis, not specified as acute or chronic: Secondary | ICD-10-CM

## 2018-11-01 DIAGNOSIS — E119 Type 2 diabetes mellitus without complications: Secondary | ICD-10-CM

## 2018-11-01 MED ORDER — BENZONATATE 100 MG PO CAPS: 100.0000 mg | ORAL_CAPSULE | Freq: Three times a day (TID) | ORAL | 0 refills | Status: DC | PRN

## 2018-11-01 NOTE — Progress Notes (Signed)
Subjective:    Patient ID: Laura Obrien, female    DOB: [birth year], 58 y.o.   MRN: [id number]  HPI   Pt was seen for cough on 10/19/18.  Rx'd with zpak, tessalon. Reports that her cough improved but is not gone.  Reports cough is "just sort of nagging."    Requesting rx for pull ups. Reports urinary and stool incontinence which has been present for a "while." Upon further questioning she reports that symptoms started last year.  Notes some weakness sometimes in her legs.    Patient is a poor historian.   Review of Systems See HPI  Past Medical History:  Diagnosis Date  . Diabetes mellitus without complication ([place])   . Fibroid, uterine   . Fibromyalgia   . Hypertension      Social History   Socioeconomic History  . Marital status: Single    Spouse name: Not on file  . Number of children: Not on file  . Years of education: Not on file  . Highest education level: Not on file  Occupational History  . Not on file  Social Needs  . Financial resource strain: Not on file  . Food insecurity:    Worry: Not on file    Inability: Not on file  . Transportation needs:    Medical: Not on file    Non-medical: Not on file  Tobacco Use  . Smoking status: Never Smoker  . Smokeless tobacco: Never Used  Substance and Sexual Activity  . Alcohol use: No  . Drug use: No  . Sexual activity: Not on file  Lifestyle  . Physical activity:    Days per week: Not on file    Minutes per session: Not on file  . Stress: Not on file  Relationships  . Social connections:    Talks on phone: Not on file    Gets together: Not on file    Attends religious service: Not on file    Active member of club or organization: Not on file    Attends meetings of clubs or organizations: Not on file    Relationship status: Not on file  . Intimate partner violence:    Fear of current or ex partner: Not on file    Emotionally abused: Not on file    Physically abused: Not on file    Forced sexual  activity: Not on file  Other Topics Concern  . Not on file  Social History Narrative  . Not on file    Past Surgical History:  Procedure Laterality Date  . ABDOMINAL HYSTERECTOMY    . BREAST SURGERY     benign mass removed/ left breast    Family History  Problem Relation Age of Onset  . Cancer Mother        breast  . Hypertension Mother   . Cancer Maternal Grandmother        lung  . Hypertension Father   . Hypertension Sister   . Hypertension Brother     No Known Allergies  Current Outpatient Medications on File Prior to Visit  Medication Sig Dispense Refill  . Blood Glucose Monitoring Suppl (GLUCOCOM BLOOD GLUCOSE MONITOR) DEVI Use daily to check blood sugar.    . Clobetasol Prop Emollient Base (CLOBETASOL PROPIONATE E) 0.05 % emollient cream Apply 1 application topically 1 day or 1 dose. Use q hs daily x 6 wks, then go to 2x/wk 30 g 5  . fluticasone (FLONASE) 50 MCG/ACT nasal spray Place 2 sprays  into both nostrils daily. 16 g 0  . Milnacipran (SAVELLA) 50 MG TABS tablet Take 1 tablet (50 mg total) by mouth 2 (two) times daily. 60 tablet 0   Current Facility-Administered Medications on File Prior to Visit  Medication Dose Route Frequency Provider Last Rate Last Dose  . chlorhexidine (HIBICLENS) 4 % liquid   Topical Daily Donnamae Jude, MD        BP 136/89 (BP Location: Right Arm, Patient Position: Sitting, Cuff Size: Small)   Pulse 66   Temp 97.9 F (36.6 C) (Oral)   Resp 16   Ht 5\' 3"  (1.6 m)   Wt 170 lb (77.1 kg)   SpO2 100%   BMI 30.11 kg/m       Objective:   Physical Exam Constitutional:      Appearance: She is well-developed.  Neck:     Musculoskeletal: Neck supple.     Thyroid: No thyromegaly.  Cardiovascular:     Rate and Rhythm: Normal rate and regular rhythm.     Heart sounds: Normal heart sounds. No murmur.  Pulmonary:     Effort: Pulmonary effort is normal. No respiratory distress.     Breath sounds: Normal breath sounds. No wheezing.    Skin:    General: Skin is warm and dry.  Neurological:     Mental Status: She is alert and oriented to person, place, and time.     Deep Tendon Reflexes:     Reflex Scores:      Patellar reflexes are 1+ on the right side and 2+ on the left side.    Comments: Bilateral LE strength is 4-5/5  Psychiatric:        Behavior: Behavior normal.        Thought Content: Thought content normal.        Judgment: Judgment normal.           Assessment & Plan:  Incontinence- new. Has diminished patellar reflex on right and bilateral LE weakness. Needs MRI to rule out spinal lesion. Rx provided for adult pullups at pt request.   Bronchitis- clinically improving. CXR performed and negative for pneumonia.  Continue tessalon prn.

## 2018-11-01 NOTE — Patient Instructions (Signed)
Complete xray on the first floor of your chest. You will be contacted about your referral for MRI. You may use tessalon as needed for cough. Call if cough worsens or if it does not continue to improve.

## 2018-11-02 ENCOUNTER — Telehealth: Payer: Self-pay | Admitting: Medical

## 2018-11-02 ENCOUNTER — Other Ambulatory Visit (HOSPITAL_COMMUNITY)
Admission: RE | Admit: 2018-11-02 | Discharge: 2018-11-02 | Disposition: A | Discharge status: Home / Self Care | Payer: Medicare Other | Source: Ambulatory Visit | Attending: Obstetrics & Gynecology | Admitting: Obstetrics & Gynecology

## 2018-11-02 ENCOUNTER — Ambulatory Visit (INDEPENDENT_AMBULATORY_CARE_PROVIDER_SITE_OTHER): Payer: Medicare Other | Admitting: Obstetrics & Gynecology

## 2018-11-02 VITALS — BP 130/82 | HR 85 | Ht 66.0 in | Wt 171.0 lb

## 2018-11-02 DIAGNOSIS — N898 Other specified noninflammatory disorders of vagina: Secondary | ICD-10-CM | POA: Diagnosis not present

## 2018-11-02 NOTE — Telephone Encounter (Signed)
Pt came in office stating had labs done 2 wks ago and has not gotten results of labs. Pt would like to be called and informed about her last lab results. Pt tel [phone]. Please advise.

## 2018-11-02 NOTE — Patient Instructions (Signed)
GO WHITE: Soap: UNSCENTED Dove (white box light green writing) Laundry detergent (underwear)- Dreft or Arm n' Hammer unscented WHITE 100% cotton panties (NOT just cotton crouch) Sanitary napkin/panty liners: UNSCENTED.  If it doesn't SAY unscented it can have a scent/perfume    NO PERFUMES OR LOTIONS OR POTIONS in the vulvar area (may use regular KY) Condoms: hypoallergenic only. Non dyed (no color) Toilet papers: white only Wash clothes: use a separate wash cloth. WHITE.  Washed in Dreft.  

## 2018-11-02 NOTE — Progress Notes (Signed)
History:  57 y.o. G0P0 here today for continued vaginal odor. Pt is not sexaully active. Her last screen for BV was negative.  Took flagyl prev with only brief resolution of sx.    The following portions of the patient's history were reviewed and updated as appropriate: allergies, current medications, past family history, past medical history, past social history, past surgical history and problem list.  Review of Systems:  Pertinent items are noted in HPI.    Objective:  Physical Exam Blood pressure 130/82, pulse 85, height 5\' 6"  (1.676 m), weight 171 lb (77.6 kg).  CONSTITUTIONAL: Well-developed, well-nourished female in no acute distress.  HENT:  Normocephalic, atraumatic EYES: Conjunctivae and EOM are normal. No scleral icterus.  NECK: Normal range of motion SKIN: Skin is warm and dry. No rash noted. Not diaphoretic.No pallor. [place]: Alert and oriented to person, place, and time. Normal coordination.   Abd: Soft, nontender and nondistended Pelvic: Normal appearing external genitalia; normal appearing vaginal mucosa and cervix.  Normal discharge.  Small uterus, no other palpable masses, no uterine or adnexal tenderness. No odor noted.   Assessment & Plan:  Vaginal odor  Last screeen for BV was neg.   Pt given GO WHITE instructions.    Rec Dove unscented soap or other unscented soap.   Total face-to-face time with patient was 65min.  Greater than 50% was spent in counseling and coordination of care with the patient.   Kollins Fenter L. Harraway-Smith, M.D., Cherlynn June

## 2018-11-02 NOTE — Progress Notes (Signed)
Patient states she is still having a bad vaginal odor. [name] RN

## 2018-11-03 NOTE — Telephone Encounter (Signed)
Patient called on both numbers listed, unable to leave VM due to no mailbox set up on home phone and cell phone mailbox is full.

## 2018-11-03 NOTE — Telephone Encounter (Addendum)
Pt given lab results per notes of Mackie Pai, [facility] on 10/19/18. Pt verbalized understanding. She says she doesn't want to start on Metformin, she will work on getting her blood sugar down herself.

## 2018-11-03 NOTE — Telephone Encounter (Signed)
Pt declined metformin but please have her follow up in 3 months for repeat 3 month blood sugar average test.

## 2018-11-03 NOTE — Telephone Encounter (Signed)
Okay for P[facility] to give information. 

## 2018-11-04 ENCOUNTER — Encounter: Payer: Self-pay | Admitting: Obstetrics & Gynecology

## 2018-11-06 LAB — CERVICOVAGINAL ANCILLARY ONLY
Bacterial vaginitis: NEGATIVE
Candida vaginitis: NEGATIVE

## 2018-11-12 ENCOUNTER — Ambulatory Visit (HOSPITAL_BASED_OUTPATIENT_CLINIC_OR_DEPARTMENT_OTHER)
Admission: RE | Admit: 2018-11-12 | Discharge: 2018-11-12 | Disposition: A | Discharge status: Home / Self Care | Payer: Medicare Other | Source: Ambulatory Visit | Attending: Family | Admitting: Family

## 2018-11-12 DIAGNOSIS — R32 Unspecified urinary incontinence: Secondary | ICD-10-CM

## 2018-11-12 DIAGNOSIS — R29898 Other symptoms and signs involving the musculoskeletal system: Secondary | ICD-10-CM

## 2018-11-14 ENCOUNTER — Telehealth: Payer: Self-pay | Admitting: Family

## 2018-11-14 ENCOUNTER — Telehealth: Payer: Self-pay

## 2018-11-14 DIAGNOSIS — M519 Unspecified thoracic, thoracolumbar and lumbosacral intervertebral disc disorder: Secondary | ICD-10-CM

## 2018-11-14 NOTE — Telephone Encounter (Signed)
Please contact pt and let her know that I reviewed her lab work and it shows bulging discs and pinched nerves in her spine.  I would like to refer her to neurosurgery.  Referral has been place.

## 2018-11-14 NOTE — Telephone Encounter (Signed)
Called pt to inform her of negative test results. Reviewed the [place] instructions with patient. Understanding was voiced.  Rajean Desantiago l Jaydin Boniface, CMA

## 2018-11-14 NOTE — Telephone Encounter (Signed)
-----   Message from Lavonia Drafts, MD sent at 11/14/2018  2:52 PM EST ----- Please call pt. Her results are negative. Please review the GO WHITE instructions below:  GO WHITE: Soap: UNSCENTED Dove (white box light green writing) Laundry detergent (underwear)- Dreft or Arm n' Hammer unscented WHITE 100% cotton panties (NOT just cotton crouch) Sanitary napkin/panty liners: UNSCENTED.  If it doesn't SAY unscented it can have a scent/perfume    NO PERFUMES OR LOTIONS OR POTIONS in the vulvar area (may use regular KY) Condoms: hypoallergenic only. Non dyed (no color) Toilet papers: white only Wash clothes: use a separate wash cloth. WHITE.  Washed in Dreft.    Also, ask pt if she is eating yogurt.  If so, please ask her to stop.   Thx, Clh-S

## 2018-11-15 NOTE — Telephone Encounter (Signed)
Advised patient of results and referral. Copy study mailed out to her at her request.

## 2018-11-24 ENCOUNTER — Telehealth: Payer: Self-pay | Admitting: Medical

## 2018-11-24 NOTE — Telephone Encounter (Signed)
Pt dropped off [place] for Property Tax Exclusion document for Laura Obrien to sign for her.  Document placed in Edward's box for pick up and completion.

## 2018-11-25 NOTE — Telephone Encounter (Signed)
Completed provider information, pt has appointment on 03/04; forwarded to provider/SLS 02/28

## 2018-11-27 ENCOUNTER — Telehealth: Payer: Self-pay | Admitting: Medical

## 2018-11-27 NOTE — Telephone Encounter (Signed)
I saw paperwork faxed back to our office confirming pt has appointment with Dr Christella Noa neurosurgeon. States pt appointment was on Feb, 25, 2020 at 3 pm. Was pt aware of this appointment. Did she go. Other provider placed referral and notification was placed in my folders. Was patient aware?

## 2018-11-27 NOTE — Telephone Encounter (Signed)
I have not seen pt disability form. I have seen her one time. Not fully aware what is her diagnosis for pt disability.

## 2018-11-27 NOTE — Telephone Encounter (Signed)
I saw note in chart from Ivin Booty about pt disability form. I have not seen the form yet. Reviewing this from home on weekend. Not sure what pt disability is as I have only seen her one time? Can you help me find form. I have not seen form?

## 2018-11-29 ENCOUNTER — Telehealth: Payer: Self-pay | Admitting: Medical

## 2018-11-29 NOTE — Telephone Encounter (Signed)
[facility] faxed request to [facility] Tasia Catchings) for medical records 11/29/18 [facility]

## 2018-11-30 ENCOUNTER — Encounter: Payer: Self-pay | Admitting: Medical

## 2018-11-30 ENCOUNTER — Ambulatory Visit (INDEPENDENT_AMBULATORY_CARE_PROVIDER_SITE_OTHER): Payer: Medicare Other | Admitting: Medical

## 2018-11-30 ENCOUNTER — Other Ambulatory Visit: Payer: Self-pay | Admitting: Medical

## 2018-11-30 ENCOUNTER — Telehealth: Payer: Self-pay | Admitting: Medical

## 2018-11-30 VITALS — BP 127/89 | HR 76 | Resp 14 | Ht 66.0 in | Wt 171.0 lb

## 2018-11-30 DIAGNOSIS — M797 Fibromyalgia: Secondary | ICD-10-CM | POA: Diagnosis not present

## 2018-11-30 DIAGNOSIS — M25511 Pain in right shoulder: Secondary | ICD-10-CM

## 2018-11-30 MED ORDER — DICLOFENAC SODIUM 75 MG PO TBEC: 75.0000 mg | DELAYED_RELEASE_TABLET | Freq: Two times a day (BID) | ORAL | 0 refills | Status: DC

## 2018-11-30 NOTE — Telephone Encounter (Signed)
Pt has disability form for me to sign off on. I don't have any records to review that I can see in media. Will you double check. If not can you resend release form to her prior primary care office.

## 2018-11-30 NOTE — Progress Notes (Signed)
Subjective:    Patient ID: Laura Obrien, female    DOB: 02/21/1961, 58 y.o.   MRN: [id number]  HPI  Pt in with rt shoulder pain. No fall or injury. Pt states no fall or injury. Pt states hurts to move. Example driving car/rotating shoulder causes pain.  Pt seen by me one time prior to this visit. She has history of fibromyalgia. On savella. During interim/since last visit I got certification of disability paper work request for me to sign. Asking me to cetrify that she is disabled. Pt states she got disability 2 years ago. Pt states prior medical provider filled out paperwork certifying her disability.   Pt states she used to work in [employer] before she got disability. She states have not worked in 5-6 years. Paperwork was cleared 2 years ago. She states former medical doctor referred her to some specialist to determined her disability.    Review of Systems  Constitutional: Negative for chills, fatigue and fever.  Respiratory: Negative for cough, chest tightness, shortness of breath and wheezing.   Cardiovascular: Negative for chest pain and palpitations.  Gastrointestinal: Negative for abdominal pain.  Musculoskeletal:       Rt shoulder pain.  Skin: Negative for rash.  Neurological: Negative for dizziness, light-headedness, numbness and headaches.  Hematological: Negative for adenopathy. Does not bruise/bleed easily.  Psychiatric/Behavioral: Negative for behavioral problems, confusion and sleep disturbance. The patient is not nervous/anxious.    Past Medical History:  Diagnosis Date  . Diabetes mellitus without complication ([place])   . Fibroid, uterine   . Fibromyalgia   . Hypertension      Social History   Socioeconomic History  . Marital status: Single    Spouse name: Not on file  . Number of children: Not on file  . Years of education: Not on file  . Highest education level: Not on file  Occupational History  . Not on file  Social Needs  . Financial resource  strain: Not on file  . Food insecurity:    Worry: Not on file    Inability: Not on file  . Transportation needs:    Medical: Not on file    Non-medical: Not on file  Tobacco Use  . Smoking status: Never Smoker  . Smokeless tobacco: Never Used  Substance and Sexual Activity  . Alcohol use: No  . Drug use: No  . Sexual activity: Not on file  Lifestyle  . Physical activity:    Days per week: Not on file    Minutes per session: Not on file  . Stress: Not on file  Relationships  . Social connections:    Talks on phone: Not on file    Gets together: Not on file    Attends religious service: Not on file    Active member of club or organization: Not on file    Attends meetings of clubs or organizations: Not on file    Relationship status: Not on file  . Intimate partner violence:    Fear of current or ex partner: Not on file    Emotionally abused: Not on file    Physically abused: Not on file    Forced sexual activity: Not on file  Other Topics Concern  . Not on file  Social History Narrative  . Not on file    Past Surgical History:  Procedure Laterality Date  . ABDOMINAL HYSTERECTOMY    . BREAST SURGERY     benign mass removed/ left breast  Family History  Problem Relation Age of Onset  . Cancer Mother        breast  . Hypertension Mother   . Cancer Maternal Grandmother        lung  . Hypertension Father   . Hypertension Sister   . Hypertension Brother     No Known Allergies  Current Outpatient Medications on File Prior to Visit  Medication Sig Dispense Refill  . benzonatate (TESSALON) 100 MG capsule Take 1 capsule (100 mg total) by mouth 3 (three) times daily as needed. 20 capsule 0  . Blood Glucose Monitoring Suppl (GLUCOCOM BLOOD GLUCOSE MONITOR) DEVI Use daily to check blood sugar.    . Clobetasol Prop Emollient Base (CLOBETASOL PROPIONATE E) 0.05 % emollient cream Apply 1 application topically 1 day or 1 dose. Use q hs daily x 6 wks, then go to 2x/wk 30  g 5  . fluticasone (FLONASE) 50 MCG/ACT nasal spray Place 2 sprays into both nostrils daily. 16 g 0  . Milnacipran (SAVELLA) 50 MG TABS tablet Take 1 tablet (50 mg total) by mouth 2 (two) times daily. 60 tablet 0   Current Facility-Administered Medications on File Prior to Visit  Medication Dose Route Frequency Provider Last Rate Last Dose  . chlorhexidine (HIBICLENS) 4 % liquid   Topical Daily Donnamae Jude, MD        BP 127/89   Pulse 76   Resp 14   Ht 5\' 6"  (1.676 m)   Wt 171 lb (77.6 kg)   SpO2 100%   BMI 27.60 kg/m       Objective:   Physical Exam  General Mental Status- Alert. General Appearance- Not in acute distress.   Skin General: Color- Normal Color. Moisture- Normal Moisture.  Neck Carotid Arteries- Normal color. Moisture- Normal Moisture. No carotid bruits. No JVD.  Chest and Lung Exam Auscultation: Breath Sounds:-Normal.  Cardiovascular Auscultation:Rythm- Regular. Murmurs & Other Heart Sounds:Auscultation of the heart reveals- No Murmurs.  Abdomen Inspection:-Inspeection Normal. Palpation/Percussion:Note:No mass. Palpation and Percussion of the abdomen reveal- Non Tender, Non Distended + BS, no rebound or guarding.    Neurologic Cranial Nerve exam:- CN III-XII intact(No nystagmus), symmetric smile. Strength:- 5/5 equal and symmetric strength both upper and lower extremities.  Rt shoulder- pain on palpation anterior aspect. And mild pain on range of motion. Skin not warm or swollen.      Assessment & Plan:  For your recent right shoulder pain, I do want to get x-ray of shoulder today.  I want you to stop ibuprofen and try diclofenac to see if this helps better with the pain.  Recommend that you do some slight mild range of motion exercises so you do not get frozen shoulder.  Regarding your history of fibromyalgia and you needing someone to sign off on your certification of disability.  I am only seeing you twice and I do not see that we have  your records.  I have sent a note to office staff members to see if we have those records.  I want you to contact your prior PCP office and have them send those records.  After reviewing those would be able to determine if I can sign your form.  Also will review this with my supervising MD.   Follow-up in 2 weeks or as needed.  If pain is shoulder not improving then may refer you to sports medicine.  Mackie Pai, PA-C

## 2018-11-30 NOTE — Patient Instructions (Signed)
For your recent right shoulder pain, I do want to get x-ray of shoulder today.  I want you to stop ibuprofen and try diclofenac to see if this helps better with the pain.  Recommend that you do some slight mild range of motion exercises so you do not get frozen shoulder.  Regarding your history of fibromyalgia and you needing someone to sign off on your certification of disability.  I am only seeing you twice and I do not see that we have your records.  I have sent a note to office staff members to see if we have those records.  I want you to contact your prior PCP office and have them send those records.  After reviewing those would be able to determine if I can sign your form.  Also will review this with my supervising MD.   Follow-up in 2 weeks or as needed.  If pain is shoulder not improving then may refer you to sports medicine.

## 2018-12-01 ENCOUNTER — Ambulatory Visit (HOSPITAL_BASED_OUTPATIENT_CLINIC_OR_DEPARTMENT_OTHER)
Admission: RE | Admit: 2018-12-01 | Discharge: 2018-12-01 | Disposition: A | Discharge status: Home / Self Care | Payer: Medicare Other | Source: Ambulatory Visit | Attending: Medical | Admitting: Medical

## 2018-12-01 DIAGNOSIS — M25511 Pain in right shoulder: Secondary | ICD-10-CM | POA: Diagnosis not present

## 2018-12-01 NOTE — Telephone Encounter (Signed)
I can't find her old records in epic. If you can't find them either can we call that office and ask them to send records. Pt signed release form already? Will you update patient what you find out.

## 2018-12-01 NOTE — Telephone Encounter (Signed)
Pt called back to check status. She would like a call back regarding. CB#[phone]

## 2018-12-13 ENCOUNTER — Telehealth: Payer: Self-pay | Admitting: Medical

## 2018-12-13 NOTE — Telephone Encounter (Signed)
Patient is calling back  °

## 2018-12-13 NOTE — Telephone Encounter (Signed)
Please advise 

## 2018-12-13 NOTE — Telephone Encounter (Signed)
Copied from [place] [id number]. Topic: General - Inquiry >> Dec 13, 2018  8:55 AM Reyne Dumas L wrote: Reason for CRM:   Pt was seen by Mackie Pai on 03/04 for right shoulder pain.  Pt states that the medication she was given didn't work and she is still in pain.  Pt wants to know if there is something else that she can be given or if she needs to come back in for a visit. Pt can be reached at [phone]

## 2018-12-14 MED ORDER — PREDNISONE 5 MG (21) PO TBPK: ORAL_TABLET | ORAL | 0 refills | Status: DC

## 2018-12-14 NOTE — Telephone Encounter (Signed)
Rx prednisone sent to pt pharmacy. Stop diclofenac. Will refer pt to sports med MD. Low sugar diet while on prednisone.

## 2018-12-14 NOTE — Telephone Encounter (Signed)
Spoke w/ Pt- informed of recommendations. Pt verbalized understanding.  

## 2018-12-22 NOTE — Telephone Encounter (Signed)
Patient calling to get an update on whether this disability form has been signed yet or not? Please advise.

## 2018-12-31 ENCOUNTER — Telehealth: Payer: Self-pay | Admitting: Medical

## 2018-12-31 NOTE — Telephone Encounter (Signed)
I checked care everywhere and did not find any records indicating that former provider gave her disability. I usually don't fill out disability paperwork but refer to specialist. I usually leave it up to specialist. Was hoping to see records send from her former specialist that explained she was disable due to fibromyalgia. I would ask her to call clinic that gave her disability as they should have paperwork and would have easiest ability to fill out paperwork.   I did not promise to fill out paperwork but wanted to review records. No records yet sent over so do recommend she call old office explain situation and ask to be seen specifically to fill out form. As they have records.

## 2018-12-31 NOTE — Telephone Encounter (Signed)
Dr. Etter Sjogren,  Wanted your advise. This is patient that I have seen about 3 times. She state disabled from fibromyalgia. She has paperwork to fill out/renew. I have asked for old records to review and have not received them. I usually don't fill out disability paperwork but defer to specialist. Been waiting for records and can't think of specialist to refer her to that would fill out for diagnosis. Particularly during current situation with a lot of specialist office closed. I am asking her to contact former office who have records to fill out the paperwork. Any suggestions?  Thanks, Mackie Pai, PA-C

## 2019-01-02 NOTE — Telephone Encounter (Signed)
I usually do not fill it out either.   Is this for SS disability.  They usually need a lawyer and go to a disability dr---  They lawyer usually sets that up for them --- at least that is what Donnald Garre done in the past   It has been a while though

## 2019-01-02 NOTE — Telephone Encounter (Signed)
I'll look at form more in detail when I am back in office.  Thanks.

## 2019-01-05 NOTE — Telephone Encounter (Signed)
Pt state she sent records to the office in the mail should get here today or early part of next week.

## 2019-01-11 ENCOUNTER — Telehealth: Payer: Self-pay | Admitting: *Deleted

## 2019-01-11 NOTE — Telephone Encounter (Signed)
Received Medical records from [facility]; forwarded to provider/SLS 04/15

## 2019-01-12 NOTE — Telephone Encounter (Signed)
Pt called checking on status of disability paperwork. Pt was informed of the note left that says finished paperwork was sent to provider on 01/11/2019. Pt was advised it would take 5-7 business days from that date for her paperwork to be completed. Pt stated she didn't understand how the paperwork had just been sent to provider when she had been inquiring about it since the 4th. Please advise.

## 2019-01-13 NOTE — Telephone Encounter (Signed)
Copy & Pasted: I only received medical records; I have not had the disability paperwork at any time SLS 04/17 Me   2:20 PM  Note    Received Medical records from [facility]; forwarded to provider/SLS 04/15     Copy & pasted from 12/31/18 note: Conversation  (Newest Message First)  January 02, 2019    5:33 PM  Saguier, Percell Miller, PA-C routed this conversation to [employer], Alferd Apa, DO  Saguier, Percell Miller, PA-C   5:33 PM  Note    I'll look at form more in detail when I am back in office.  Thanks.    Ann Held, DO  to Mackie Pai, PA-C      8:02 AM  Note    I usually do not fill it out either.   Is this for SS disability.  They usually need a lawyer and go to a disability dr---  They lawyer usually sets that up for them --- at least that is what Donnald Garre done in the past   It has been a while though    December 31, 2018   11:18 AM  Saguier, Percell Miller, PA-C routed this conversation to Carollee Herter, Alferd Apa, DO  Saguier, Percell Miller, PA-C   11:18 AM  Note    Dr. Etter Sjogren,  Wanted your advise. This is patient that I have seen about 3 times. She state disabled from fibromyalgia. She has paperwork to fill out/renew. I have asked for old records to review and have not received them. I usually don't fill out disability paperwork but defer to specialist. Been waiting for records and can't think of specialist to refer her to that would fill out for diagnosis. Particularly during current situation with a lot of specialist office closed. I am asking her to contact former office who have records to fill out the paperwork. Any suggestions?  Thanks, Mackie Pai, PA-C

## 2019-01-13 NOTE — Telephone Encounter (Signed)
I got her old records and saw the forms in my folders.

## 2019-01-17 NOTE — Telephone Encounter (Signed)
Pt sent her records to office and is inquiring about when her disability forms can be mailed back to her once completed/ please advise

## 2019-01-20 NOTE — Telephone Encounter (Signed)
Notified pt. 

## 2019-01-20 NOTE — Telephone Encounter (Signed)
Let pt know that did get records last week. In process of reviewing. Will fill out/signs form by early next week.

## 2019-01-20 NOTE — Telephone Encounter (Signed)
Dr. Etter Sjogren,  I got pt extensive records about her disability work up and determination previously. I am going to sign her disability form if you are ok with that. Also include letter about her being my patient briefly but that I have reviewed all records and can make available if needed. Just wanted your opinion. I was hesitant initially but now feel comfortable since have records to back up.  Thanks, Percell Miller

## 2019-01-20 NOTE — Telephone Encounter (Signed)
That is fine 

## 2019-01-23 ENCOUNTER — Telehealth: Payer: Self-pay | Admitting: Medical

## 2019-01-23 NOTE — Telephone Encounter (Signed)
I don't see that skelaxin was written by me in past. Ask patient to schedule virtual visit to discuss.

## 2019-01-23 NOTE — Telephone Encounter (Signed)
Patient informed paperwork ready for p/u during regular business hours; she request that it be mailed to her home; verified mailing address. Patient also request refill on RX: Skelexin 800 mg; this medication is not on her med list, did you see it documented while reviewing her medical records.? Please Advise on refills and/or decline/defer to sports medicine/SLS 04/27

## 2019-01-23 NOTE — Telephone Encounter (Signed)
Reviewed pt prior disability determination work. Signed sheet and typed letter expalining that made decision on paperwork I reviewed. Placed records for scanning.  Giving you sheet with my letter.

## 2019-01-25 ENCOUNTER — Ambulatory Visit (INDEPENDENT_AMBULATORY_CARE_PROVIDER_SITE_OTHER): Payer: Medicare Other | Admitting: Medical

## 2019-01-25 ENCOUNTER — Telehealth: Payer: Self-pay | Admitting: Medical

## 2019-01-25 ENCOUNTER — Other Ambulatory Visit: Payer: Self-pay

## 2019-01-25 ENCOUNTER — Encounter: Payer: Self-pay | Admitting: Medical

## 2019-01-25 ENCOUNTER — Other Ambulatory Visit: Payer: Self-pay | Admitting: Medical

## 2019-01-25 VITALS — BP 134/83 | HR 76

## 2019-01-25 DIAGNOSIS — M545 Low back pain, unspecified: Secondary | ICD-10-CM

## 2019-01-25 DIAGNOSIS — M797 Fibromyalgia: Secondary | ICD-10-CM

## 2019-01-25 DIAGNOSIS — R030 Elevated blood-pressure reading, without diagnosis of hypertension: Secondary | ICD-10-CM | POA: Diagnosis not present

## 2019-01-25 DIAGNOSIS — E119 Type 2 diabetes mellitus without complications: Secondary | ICD-10-CM | POA: Diagnosis not present

## 2019-01-25 DIAGNOSIS — G8929 Other chronic pain: Secondary | ICD-10-CM

## 2019-01-25 DIAGNOSIS — M519 Unspecified thoracic, thoracolumbar and lumbosacral intervertebral disc disorder: Secondary | ICD-10-CM

## 2019-01-25 MED ORDER — METFORMIN HCL 500 MG PO TABS: 500.0000 mg | ORAL_TABLET | Freq: Every day | ORAL | 0 refills | Status: DC

## 2019-01-25 MED ORDER — METAXALONE 800 MG PO TABS: ORAL_TABLET | ORAL | 0 refills | Status: DC

## 2019-01-25 MED ORDER — GLUCOSE BLOOD VI STRP: ORAL_STRIP | 12 refills | Status: AC

## 2019-01-25 NOTE — Telephone Encounter (Signed)
Will you send rx of strips to pt pharmacy. Brand of glucometer on list. Sig: check sugars twice daily.

## 2019-01-25 NOTE — Addendum Note (Signed)
Addended by: Hinton Dyer on: 01/25/2019 02:37 PM   Modules accepted: Orders

## 2019-01-25 NOTE — Progress Notes (Signed)
Subjective:    Patient ID: Laura Obrien, female    DOB: 10-04-1960, 58 y.o.   MRN: [id number]  HPI  Virtual Visit via Video Note  I connected with [name] on 01/25/19 at  1:20 PM EDT by a video enabled telemedicine application and verified that I am speaking with the correct person using two identifiers.   I discussed the limitations of evaluation and management by telemedicine and the availability of in person appointments. The patient expressed understanding and agreed to proceed.     History of Present Illness: Pt called and states she needs refill of skelaxin. She states in past she had used it for muscle pain which she attributed to fibromyalgia.  Pt states occasional low back pain for which she states skelaxin. No pain radiating to legs and no leg weakness. No loss of bladder dysfunction. She states ran out a while ago but lower back pain restarted just recently. No fall or trauma. I reviewed disability determination paperwork and think I remember comment on lower back pain. But records now being placed to scan.  No radiating pain to legs. No numbness or leg weakness.  Pt does have some disc issues by mri and was referred to neurosurgeon. She had appointment canceled due to covid pandemic.  Pt mri summary reads.  IMPRESSION: The significant findings are likely at the L4-5 level. 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 Neural compression quite possible this level, particularly on the left. There is pronounced discogenic edema within the L4 and L5 vertebral bodies, likely associated with back pain.    Observations/Objective: No acute distress. Normal affect. Alert and oriented. Speaks normal pattern. Back- faint pain lower back when she twists thorax to rt and left. Mild-moderate pain leaning forward then sitting straight.  No foot numbness  Assessment and Plan: For back pain, I did rx skelaxin  and may rx diclofenac but her bp is higher than usual and want her to check bp today again and tomorrow. Then update me tomorrow on reading. If lower will rx nsaid. For back pain reviewed mri and discussed those finding. Red flag signs and symptoms discussed that would indicate ED evaluation. See neurosurgeon on date she is rescheduled.  Elevated bp as stated above. Will wait for update on repeat check tomorrow. No hx of htn  For diabetes, he a1c was 6.5. She will check sugars twice daily in am fasting and one post meal. Will send in low dose metformin. This is what I had planned in January. Start low dose to avoid side effects. Increase if needed.  For fibromyalgia continue savella.  Follow up July for in office visit and repeat labs. 25 minutes spent with pt. 50% of time spent counseling on treatment plan for conditions. Answered all questions.  Follow Up Instructions:    I discussed the assessment and treatment plan with the patient. The patient was provided an opportunity to ask questions and all were answered. The patient agreed with the plan and demonstrated an understanding of the instructions.   The patient was advised to call back or seek an in-person evaluation if the symptoms worsen or if the condition fails to improve as anticipated.    Mackie Pai, PA-C    Review of Systems  Constitutional: Negative for chills, fatigue and fever.  Respiratory: Negative for cough, chest tightness, shortness of breath and wheezing.   Cardiovascular: Negative for chest pain and palpitations.  Gastrointestinal: Negative for abdominal  pain, constipation, diarrhea and vomiting.  Endocrine: Negative for polydipsia and polyphagia.  Genitourinary: Negative for dyspareunia, dysuria and urgency.  Musculoskeletal: Positive for back pain. Negative for arthralgias, joint swelling, myalgias, neck pain and neck stiffness.  Skin: Negative for rash.  Hematological: Negative for adenopathy. Does not  bruise/bleed easily.  Psychiatric/Behavioral: Negative for behavioral problems and confusion.   Past Medical History:  Diagnosis Date  . Diabetes mellitus without complication ([place])   . Fibroid, uterine   . Fibromyalgia   . Hypertension      Social History   Socioeconomic History  . Marital status: Single    Spouse name: Not on file  . Number of children: Not on file  . Years of education: Not on file  . Highest education level: Not on file  Occupational History  . Not on file  Social Needs  . Financial resource strain: Not on file  . Food insecurity:    Worry: Not on file    Inability: Not on file  . Transportation needs:    Medical: Not on file    Non-medical: Not on file  Tobacco Use  . Smoking status: Never Smoker  . Smokeless tobacco: Never Used  Substance and Sexual Activity  . Alcohol use: No  . Drug use: No  . Sexual activity: Not on file  Lifestyle  . Physical activity:    Days per week: Not on file    Minutes per session: Not on file  . Stress: Not on file  Relationships  . Social connections:    Talks on phone: Not on file    Gets together: Not on file    Attends religious service: Not on file    Active member of club or organization: Not on file    Attends meetings of clubs or organizations: Not on file    Relationship status: Not on file  . Intimate partner violence:    Fear of current or ex partner: Not on file    Emotionally abused: Not on file    Physically abused: Not on file    Forced sexual activity: Not on file  Other Topics Concern  . Not on file  Social History Narrative  . Not on file    Past Surgical History:  Procedure Laterality Date  . ABDOMINAL HYSTERECTOMY    . BREAST SURGERY     benign mass removed/ left breast    Family History  Problem Relation Age of Onset  . Cancer Mother        breast  . Hypertension Mother   . Cancer Maternal Grandmother        lung  . Hypertension Father   . Hypertension Sister   .  Hypertension Brother     No Known Allergies  Current Outpatient Medications on File Prior to Visit  Medication Sig Dispense Refill  . benzonatate (TESSALON) 100 MG capsule Take 1 capsule (100 mg total) by mouth 3 (three) times daily as needed. 20 capsule 0  . Blood Glucose Monitoring Suppl (GLUCOCOM BLOOD GLUCOSE MONITOR) DEVI Use daily to check blood sugar.    . Clobetasol Prop Emollient Base (CLOBETASOL PROPIONATE E) 0.05 % emollient cream Apply 1 application topically 1 day or 1 dose. Use q hs daily x 6 wks, then go to 2x/wk 30 g 5  . diclofenac (VOLTAREN) 75 MG EC tablet Take 1 tablet (75 mg total) by mouth 2 (two) times daily. 30 tablet 0  . fluticasone (FLONASE) 50 MCG/ACT nasal spray Place 2  sprays into both nostrils daily. 16 g 0  . Milnacipran (SAVELLA) 50 MG TABS tablet Take 1 tablet (50 mg total) by mouth 2 (two) times daily. 60 tablet 0  . predniSONE (STERAPRED UNI-PAK 21 TAB) 5 MG (21) TBPK tablet Taper as directed 21 tablet 0   Current Facility-Administered Medications on File Prior to Visit  Medication Dose Route Frequency Provider Last Rate Last Dose  . chlorhexidine (HIBICLENS) 4 % liquid   Topical Daily Donnamae Jude, MD        There were no vitals taken for this visit.      Objective:   Physical Exam        Assessment & Plan:

## 2019-01-25 NOTE — Telephone Encounter (Signed)
Left pt a message to call back and schedule vov.

## 2019-01-25 NOTE — Patient Instructions (Addendum)
For back pain, I did rx skelaxin and may rx diclofenac but her bp is higher than usual and want her to check bp today again and tomorrow. Then update me tomorrow on reading. If lower will rx nsaid. For back pain reviewed mri and discussed those finding. Red flag signs and symptoms discussed that would indicate ED evaluation. See neurosurgeon on date she is rescheduled.  Elevated bp as stated above. Will wait for update on repeat check tomorrow. No hx of htn  For diabetes, he a1c was 6.5. She will check sugars twice daily in am fasting and one post meal. Will send in low dose metformin. This is what I had planned in January. Start low dose to avoid side effects. Increase if needed.  For fibromyalgia continue savella.  Follow up July for in office visit and repeat labs.

## 2019-01-26 ENCOUNTER — Telehealth: Payer: Self-pay | Admitting: Medical

## 2019-01-26 ENCOUNTER — Encounter: Payer: Self-pay | Admitting: Medical

## 2019-01-26 NOTE — Telephone Encounter (Signed)
addended bp reading.

## 2019-01-26 NOTE — Telephone Encounter (Signed)
Copied from [place] [id number]. Topic: Quick Communication - See Telephone Encounter >> Jan 26, 2019 10:04 AM Sheran Luz wrote: CRM for notification. See Telephone encounter for: 01/26/19.  Patient calling to leave her BP readings, as advised.   134/83 at 10:00. Will call back with afternoon reading.

## 2019-01-31 ENCOUNTER — Ambulatory Visit: Payer: Self-pay | Admitting: *Deleted

## 2019-01-31 ENCOUNTER — Telehealth: Payer: Self-pay | Admitting: Medical

## 2019-01-31 NOTE — Telephone Encounter (Signed)
Patient was prescribed Metformin 500 MG tab once daily on 5/1. She is calling to report her blood sugars are between 90-102 fasting in the mornings- 1. wants to know if she should take the "sugar medicine"?  Last A1c was 6.5 on 10/19/18.  2.A referral for her abnormal MRI lumbar spine and pain was made on 11/14/18. She is requesting for a referral in the [facility] area so that she can drive herself, please. Routing to PCP for follow-up with patient.  Patient will not take Metformin until she hears back from PCP.Understands it may be in the morning before she gets a return call. Reason for Disposition . [1] Follow-up call from patient regarding patient's clinical status AND [2] information NON-URGENT  Answer Assessment - Initial Assessment Questions 1. REASON FOR CALL or QUESTION: "What is your reason for calling today?" or "How can I best help you?" or "What question do you have that I can help answer?"     Blood sugars are 90-100 fasting every morning.  2. CALLER: Document the source of call. (e.g., laboratory, patient).     Patient.  Protocols used: PCP CALL - NO TRIAGE-A-AH

## 2019-01-31 NOTE — Telephone Encounter (Signed)
Pt wants to be referred to different neurosurgeon. She request one in [place]. Melissa already put in referral but pt now wants to change the location. Will you work on that.  Pt just let me know she wants to change location. Does she complete new referral? Let me know and can place new referral. Can you use old referral?

## 2019-01-31 NOTE — Telephone Encounter (Signed)
Please advise 

## 2019-01-31 NOTE — Telephone Encounter (Signed)
Regarding pt a1-c diabetes. Yes her sugars are controlled in mornings but she is still diabetic. Metformin can keep her sugars controlled and should not cause any hypoglycemia as other diabetic can. So continue metformin.

## 2019-02-01 NOTE — Telephone Encounter (Signed)
Looks like I wanted her to check her bp for 2-3 days in a row and let me know what those readings were. She never called me with those readings. So ask her to schedule virtual visit. And then schedule for Friday or Monday. I was going to prescribe her diclofenac but need to review bp readings first.

## 2019-02-01 NOTE — Telephone Encounter (Signed)
Pt told that PCP states she need to continue medication. Pt wants to know if something will be sent in for back back states sometmie she is not about to get up and move around.

## 2019-02-02 NOTE — Telephone Encounter (Signed)
Pt scheduled for tomorrow pt also made aware to check blood pressure before visit.

## 2019-02-03 ENCOUNTER — Other Ambulatory Visit: Payer: Self-pay

## 2019-02-03 ENCOUNTER — Ambulatory Visit (INDEPENDENT_AMBULATORY_CARE_PROVIDER_SITE_OTHER): Payer: Medicare Other | Admitting: Medical

## 2019-02-03 ENCOUNTER — Encounter: Payer: Self-pay | Admitting: Medical

## 2019-02-03 VITALS — BP 117/78 | HR 92 | Ht 66.0 in | Wt 160.0 lb

## 2019-02-03 DIAGNOSIS — M545 Low back pain, unspecified: Secondary | ICD-10-CM

## 2019-02-03 DIAGNOSIS — R030 Elevated blood-pressure reading, without diagnosis of hypertension: Secondary | ICD-10-CM | POA: Diagnosis not present

## 2019-02-03 DIAGNOSIS — G8929 Other chronic pain: Secondary | ICD-10-CM

## 2019-02-03 MED ORDER — DICLOFENAC SODIUM 75 MG PO TBEC: 75.0000 mg | DELAYED_RELEASE_TABLET | Freq: Two times a day (BID) | ORAL | 0 refills | Status: DC

## 2019-02-03 NOTE — Progress Notes (Signed)
   Subjective:    Patient ID: Laura Obrien, female    DOB: 11-May-1961, 58 y.o.   MRN: [id number]  HPI  Virtual Visit via Telephone Note  I connected with [name] on 02/03/19 at  2:00 PM EDT by telephone and verified that I am speaking with the correct person using two identifiers.  Location: Patient: home Provider: home.  Phone visit due to video failure.   I discussed the limitations, risks, security and privacy concerns of performing an evaluation and management service by telephone and the availability of in person appointments. I also discussed with the patient that there may be a patient responsible charge related to this service. The patient expressed understanding and agreed to proceed.   History of Present Illness: Pt states her back pain. Still having some as the other day. Muscle relaxants helped some but not enough. She wants nsaid. No red flag symptoms on review.  I had held off on nsaid rx as wanted her to check your blood pressure and call back. So she setup office visit again to review bp and discuss [place] or nsaids.  We are getting pt in with neurosurgeon due to mri findings on review.      Observations/Objective: NA since phone call.   Assessment and Plan: Back pain about the same with minimal improvement with skelaxin. Did rx diclofenac today as her bp is much better. Continue both meds as we wait on referrral to neurologist as she did have some positive findings on mri.  For elevated bp, continue to check bp every other day.Bp good today. Can continue diclofenac as long as bp less than 140/90.  Pt will call us on wed if on call regarading referral by then.  Follow up in 3 weeks or as needed  Follow Up Instructions:    I discussed the assessment and treatment plan with the patient. The patient was provided an opportunity to ask questions and all were answered. The patient agreed with the plan and demonstrated an understanding of the instructions.    The patient was advised to call back or seek an in-person evaluation if the symptoms worsen or if the condition fails to improve as anticipated.     Mackie Pai, PA-C   Review of Systems  Constitutional: Negative for chills, fatigue and fever.  Respiratory: Negative for cough, chest tightness and shortness of breath.   Cardiovascular: Negative for chest pain and palpitations.  Gastrointestinal: Negative for abdominal pain.  Musculoskeletal: Positive for back pain.  Neurological: Negative for dizziness, seizures, weakness, numbness and headaches.  Hematological: Negative for adenopathy. Does not bruise/bleed easily.  Psychiatric/Behavioral: Negative for behavioral problems and confusion.       Objective:   Physical Exam        Assessment & Plan:

## 2019-02-03 NOTE — Patient Instructions (Signed)
Back pain about the same with minimal improvement with skelaxin. Did rx diclofenac today as her bp is much better. Continue both meds as we wait on referrral to neurologist as she did have some positive findings on mri.  For elevated bp, continue to check bp every other day.Bp good today. Can continue diclofenac as long as bp less than 140/90.  Pt will call us on wed if on call regarading referral by then.  Follow up in 3 weeks or as needed

## 2019-02-07 ENCOUNTER — Telehealth: Payer: Self-pay

## 2019-02-07 NOTE — Telephone Encounter (Signed)
Pt informed of results back on February 17.

## 2019-02-07 NOTE — Telephone Encounter (Signed)
Copied from [place] [id number]. Topic: General - Other >> Feb 07, 2019 10:38 AM Rayann Heman wrote: Reason for CRM: pt called and stated that she would like a call back from the nurse regarding MRI of spine. Pt states that she has not received results in the mail yet. Please advise

## 2019-02-17 ENCOUNTER — Telehealth: Payer: Self-pay

## 2019-02-17 MED ORDER — TRAZODONE HCL 50 MG PO TABS: 25.0000 mg | ORAL_TABLET | Freq: Every evening | ORAL | 0 refills | Status: DC | PRN

## 2019-02-17 NOTE — Telephone Encounter (Signed)
Copied from [place] [id number]. Topic: General - Other >> Feb 17, 2019 10:16 AM Carolyn Stare wrote:  Pt request a refill on TRAZADONE  said what she has she has had a while cause she only take when needed   [place]

## 2019-02-17 NOTE — Telephone Encounter (Signed)
Please advise- trazodone not on med list.

## 2019-02-17 NOTE — Telephone Encounter (Signed)
I did send in trazadone 14 tablet prescription sent. Ask pt to schedule virtual visit or phone call visit before she runs out. She was on this med in past. Want to discuss medication with her before giving any further refills.

## 2019-02-21 ENCOUNTER — Other Ambulatory Visit: Payer: Self-pay

## 2019-02-21 ENCOUNTER — Ambulatory Visit (INDEPENDENT_AMBULATORY_CARE_PROVIDER_SITE_OTHER): Payer: Medicare Other | Admitting: Medical

## 2019-02-21 ENCOUNTER — Encounter: Payer: Self-pay | Admitting: Medical

## 2019-02-21 VITALS — BP 156/80 | HR 78 | Ht 66.0 in | Wt 155.0 lb

## 2019-02-21 DIAGNOSIS — M545 Low back pain, unspecified: Secondary | ICD-10-CM

## 2019-02-21 DIAGNOSIS — M797 Fibromyalgia: Secondary | ICD-10-CM | POA: Diagnosis not present

## 2019-02-21 DIAGNOSIS — G4700 Insomnia, unspecified: Secondary | ICD-10-CM | POA: Diagnosis not present

## 2019-02-21 DIAGNOSIS — R030 Elevated blood-pressure reading, without diagnosis of hypertension: Secondary | ICD-10-CM | POA: Diagnosis not present

## 2019-02-21 NOTE — Progress Notes (Signed)
Subjective:    Patient ID: Kerria Sapien, female    DOB: Aug 29, 1961, 58 y.o.   MRN: [id number]  HPI  Virtual Visit via Telephone Note  I connected with [name] on 02/21/19 at  3:00 PM EDT by telephone and verified that I am speaking with the correct person using two identifiers.  Location: Patient: home Provider: home   I discussed the limitations, risks, security and privacy concerns of performing an evaluation and management service by telephone and the availability of in person appointments. I also discussed with the patient that there may be a patient responsible charge related to this service. The patient expressed understanding and agreed to proceed.   History of Present Illness:  Pt recently needed refill of her trazadone. She uses it on as needed basis. She uses it about 1-2 times a week. Pt also is on savella. She never had any side effects when using trazadone.  Pt states her low back pain recently better and seems that diclofenac has helped as well. She now only taking it as needed.  Pt bp is mild elevated today. In past normal. Pt has machine at home to check daily.     Observations/Objective: General-pleasant, alert, oriented, normal speech.  Assessment and Plan: For fibromylgia, continue savella.  For insomnia, can continue with trazadone. She will use only as needed which is one to two nights a week. Educated on Serotonin syndrome.  Low back pain much improved. Presently none. Will use diclofenac now as need basis.  Bp mild high today. Advised check bp daily over next week. If bp over 140/90 then would rx med. Doubt will be high as it was lower in past on visits.  Follow up in 3 months or as needed  25 minutes spent with pt. 50% of time spent counseling pt on plan going forward on diagnosis and explaining serotonin system and potential syndrome with meds. Answered pt questions. Will get staff to mail education sheet and avs to her since not sure if  she has my chart.  Follow Up Instructions:    I discussed the assessment and treatment plan with the patient. The patient was provided an opportunity to ask questions and all were answered. The patient agreed with the plan and demonstrated an understanding of the instructions.   The patient was advised to call back or seek an in-person evaluation if the symptoms worsen or if the condition fails to improve as anticipated.  I provided 25 minutes of non-face-to-face time during this encounter.   Mackie Pai, PA-C    Review of Systems  Constitutional: Negative for chills, diaphoresis and fatigue.  Respiratory: Negative for chest tightness and shortness of breath.   Cardiovascular: Negative for chest pain and palpitations.  Gastrointestinal: Negative for abdominal pain.  Genitourinary: Negative for dysuria and flank pain.  Musculoskeletal: Negative for back pain.  Skin: Negative for rash.  Neurological: Negative for dizziness, speech difficulty, weakness, numbness and headaches.  Hematological: Negative for adenopathy. Does not bruise/bleed easily.  Psychiatric/Behavioral: Positive for sleep disturbance. Negative for behavioral problems, dysphoric mood and suicidal ideas. The patient is not nervous/anxious.    Past Medical History:  Diagnosis Date  . Diabetes mellitus without complication ([place])   . Fibroid, uterine   . Fibromyalgia   . Hypertension      Social History   Socioeconomic History  . Marital status: Single    Spouse name: Not on file  . Number of children: Not on file  . Years of education:  Not on file  . Highest education level: Not on file  Occupational History  . Not on file  Social Needs  . Financial resource strain: Not on file  . Food insecurity:    Worry: Not on file    Inability: Not on file  . Transportation needs:    Medical: Not on file    Non-medical: Not on file  Tobacco Use  . Smoking status: Never Smoker  . Smokeless tobacco: Never Used   Substance and Sexual Activity  . Alcohol use: No  . Drug use: No  . Sexual activity: Not on file  Lifestyle  . Physical activity:    Days per week: Not on file    Minutes per session: Not on file  . Stress: Not on file  Relationships  . Social connections:    Talks on phone: Not on file    Gets together: Not on file    Attends religious service: Not on file    Active member of club or organization: Not on file    Attends meetings of clubs or organizations: Not on file    Relationship status: Not on file  . Intimate partner violence:    Fear of current or ex partner: Not on file    Emotionally abused: Not on file    Physically abused: Not on file    Forced sexual activity: Not on file  Other Topics Concern  . Not on file  Social History Narrative  . Not on file    Past Surgical History:  Procedure Laterality Date  . ABDOMINAL HYSTERECTOMY    . BREAST SURGERY     benign mass removed/ left breast    Family History  Problem Relation Age of Onset  . Cancer Mother        breast  . Hypertension Mother   . Cancer Maternal Grandmother        lung  . Hypertension Father   . Hypertension Sister   . Hypertension Brother     No Known Allergies  Current Outpatient Medications on File Prior to Visit  Medication Sig Dispense Refill  . Blood Glucose Monitoring Suppl (GLUCOCOM BLOOD GLUCOSE MONITOR) DEVI Use daily to check blood sugar.    . Clobetasol Prop Emollient Base (CLOBETASOL PROPIONATE E) 0.05 % emollient cream Apply 1 application topically 1 day or 1 dose. Use q hs daily x 6 wks, then go to 2x/wk 30 g 5  . diclofenac (VOLTAREN) 75 MG EC tablet Take 1 tablet (75 mg total) by mouth 2 (two) times daily. 30 tablet 0  . fluticasone (FLONASE) 50 MCG/ACT nasal spray Place 2 sprays into both nostrils daily. 16 g 0  . glucose blood test strip Check blood sugar twice daily,. (E11.9) 100 each 12  . metaxalone (SKELAXIN) 800 MG tablet 1 tab po q  8 hours if needed for back muscle  pain 30 tablet 0  . metFORMIN (GLUCOPHAGE) 500 MG tablet Take 1 tablet (500 mg total) by mouth daily with breakfast. 90 tablet 1  . Milnacipran (SAVELLA) 50 MG TABS tablet Take 1 tablet (50 mg total) by mouth 2 (two) times daily. 60 tablet 0  . ONETOUCH VERIO test strip TEST EVERY DAY AT ALTERNATING TIMES 100 each 12  . predniSONE (STERAPRED UNI-PAK 21 TAB) 5 MG (21) TBPK tablet Taper as directed 21 tablet 0  . traZODone (DESYREL) 50 MG tablet Take 0.5-1 tablets (25-50 mg total) by mouth at bedtime as needed for sleep. 14 tablet 0  Current Facility-Administered Medications on File Prior to Visit  Medication Dose Route Frequency Provider Last Rate Last Dose  . chlorhexidine (HIBICLENS) 4 % liquid   Topical Daily Donnamae Jude, MD        BP (!) 156/80   Pulse 78   Ht 5\' 6"  (1.676 m)   Wt 155 lb (70.3 kg)   BMI 25.02 kg/m       Objective:   Physical Exam        Assessment & Plan:

## 2019-02-21 NOTE — Patient Instructions (Addendum)
For fibromylgia, continue savella.  For insomnia, can continue with trazadone. She will use only as needed which is one to two nights a week. Educated on Serotonin syndrome.  Low back pain much improved. Presently none. Will use diclofenac now as need basis.  Bp mild high today. Advised check bp daily over next week. If bp over 140/90 then would rx med. Doubt will be high as it was lower in past on visits.  Follow up in 3 months or as needed   Serotonin Syndrome Serotonin is a chemical in your body (neurotransmitter) that helps to control several functions, such as:  Brain and nerve cell function.  Mood and emotions.  Memory.  Eating.  Sleeping.  Sexual activity.  Stress response. Having too much serotonin in your body can cause serotonin syndrome. This condition can be harmful to your brain and nerve cells. This can be a life-threatening condition. What are the causes? This condition may be caused by taking medicines or drugs that increase the level of serotonin in your body, such as:  Antidepressant medicines.  Migraine medicines.  Certain pain medicines.  Certain drugs, including ecstasy, LSD, cocaine, and amphetamines.  Over-the-counter cough or cold medicines that contain dextromethorphan.  Certain herbal supplements, including St. John's wort, ginseng, and nutmeg. This condition usually occurs when you take these medicines or drugs in combination, but it can also happen with a high dose of a single medicine or drug. What increases the risk? You are more likely to develop this condition if:  You just started taking a medicine or drug that increases the level of serotonin in the body.  You recently increased the dose of a medicine or drug that increases the level of serotonin in the body.  You take more than one medicine or drug that increases the level of serotonin in the body. What are the signs or symptoms? Symptoms of this condition usually start within  several hours of taking a medicine or drug. Symptoms may be mild or severe. Mild symptoms include:  Sweating.  Restlessness or agitation.  Muscle twitching or stiffness.  Rapid heart rate.  Nausea and vomiting.  Diarrhea.  Headache.  Shivering or goose bumps.  Confusion. Severe symptoms include:  Irregular heartbeat.  Seizures.  Loss of consciousness.  High fever. How is this diagnosed? This condition may be diagnosed based on:  Your medical history.  A physical exam.  Your prior use of drugs and medicines.  Blood or urine tests. These may be used to rule out other causes of your symptoms. How is this treated? The treatment for this condition depends on the severity of your symptoms.  For mild cases, stopping the medicine or drug that caused your condition is usually all that is needed.  For moderate to severe cases, treatment in a hospital may be needed to prevent or manage life-threatening symptoms. This may include medicines to control your symptoms, IV fluids, interventions to support your breathing, and treatments to control your body temperature. Follow these instructions at home: Medicines   Take over-the-counter and prescription medicines only as told by your health care provider. This is important.  Check with your health care provider before you start taking any new prescriptions, over-the-counter medicines, herbs, or supplements.  Avoid combining any medicines that can cause this condition to occur. Lifestyle   Maintain a healthy lifestyle. ? Eat a healthy diet that includes plenty of vegetables, fruits, whole grains, low-fat dairy products, and lean protein. Do not eat a lot of foods  that are high in fat, added sugars, or salt. ? Get the right amount and quality of sleep. Most adults need 7-9 hours of sleep each night. ? Make time to exercise, even if it is only for short periods of time. Most adults should exercise for at least 150 minutes each  week. ? Do not drink alcohol. ? Do not use illegal drugs, and do not take medicines for reasons other than they are prescribed. General instructions  Do not use any products that contain nicotine or tobacco, such as cigarettes and e-cigarettes. If you need help quitting, ask your health care provider.  Keep all follow-up visits as told by your health care provider. This is important. Contact a health care provider if:  Your symptoms do not improve or they get worse. Get help right away if you:  Have worsening confusion, severe headache, chest pain, high fever, seizures, or loss of consciousness.  Experience serious side effects of medicine, such as swelling of your face, lips, tongue, or throat.  Have serious thoughts about hurting yourself or others. These symptoms may represent a serious problem that is an emergency. Do not wait to see if the symptoms will go away. Get medical help right away. Call your local emergency services (911 in the [place] Do not drive yourself to the hospital. If you ever feel like you may hurt yourself or others, or have thoughts about taking your own life, get help right away. You can go to your nearest emergency department or call:  Your local emergency services (911 in the [place]  A suicide crisis helpline, such as the [place] at 904 424 3378. This is open 24 hours a day. Summary  Serotonin is a brain chemical that helps to regulate the nervous system. High levels of serotonin in the body can cause serotonin syndrome, which is a very dangerous condition.  This condition may be caused by taking medicines or drugs that increase the level of serotonin in your body.  Treatment depends on the severity of your symptoms. For mild cases, stopping the medicine or drug that caused your condition is usually all that is needed.  Check with your health care provider before you start taking any new prescriptions, over-the-counter  medicines, herbs, or supplements. This information is not intended to replace advice given to you by your health care provider. Make sure you discuss any questions you have with your health care provider. Document Released: 10/22/2004 Document Revised: 10/22/2017 Document Reviewed: 10/22/2017 Elsevier Interactive Patient Education  2019 [employer].

## 2019-02-21 NOTE — Telephone Encounter (Signed)
Appt scheduled for 3pm today- virtually.

## 2019-03-08 ENCOUNTER — Telehealth: Payer: Self-pay | Admitting: Medical

## 2019-03-08 NOTE — Telephone Encounter (Signed)
Copied from [place] [id number]. Topic: Quick Communication - Rx Refill/Question >> Mar 08, 2019 10:38 AM Nathanial Millman J wrote: Medication: something for cold.  Body aches, nose stopped up, no appetite, no fever.  Has the patient contacted their pharmacy? No. (Agent: If no, request that the patient contact the pharmacy for the refill.) (Agent: If yes, when and what did the pharmacy advise?)  Preferred Pharmacy (with phone number or street name): [facility] [address]  Pt is asking for somehting to be called in, please call [phone] if any questions   Agent: Please be advised that RX refills may take up to 3 business days. We ask that you follow-up with your pharmacy.

## 2019-03-08 NOTE — Telephone Encounter (Signed)
Pt appointment scheduled for tomorrow.

## 2019-03-09 ENCOUNTER — Other Ambulatory Visit: Payer: Medicare Other

## 2019-03-09 ENCOUNTER — Telehealth: Payer: Self-pay | Admitting: General Practice

## 2019-03-09 ENCOUNTER — Telehealth: Payer: Self-pay | Admitting: Medical

## 2019-03-09 ENCOUNTER — Other Ambulatory Visit: Payer: Self-pay | Admitting: *Deleted

## 2019-03-09 ENCOUNTER — Other Ambulatory Visit: Payer: Self-pay

## 2019-03-09 ENCOUNTER — Ambulatory Visit (INDEPENDENT_AMBULATORY_CARE_PROVIDER_SITE_OTHER): Payer: Medicare Other | Admitting: Medical

## 2019-03-09 DIAGNOSIS — M791 Myalgia, unspecified site: Secondary | ICD-10-CM | POA: Diagnosis not present

## 2019-03-09 DIAGNOSIS — Z20822 Contact with and (suspected) exposure to covid-19: Secondary | ICD-10-CM

## 2019-03-09 DIAGNOSIS — J40 Bronchitis, not specified as acute or chronic: Secondary | ICD-10-CM | POA: Diagnosis not present

## 2019-03-09 MED ORDER — BENZONATATE 100 MG PO CAPS: 100.0000 mg | ORAL_CAPSULE | Freq: Three times a day (TID) | ORAL | 0 refills | Status: DC | PRN

## 2019-03-09 MED ORDER — AZITHROMYCIN 250 MG PO TABS: ORAL_TABLET | ORAL | 0 refills | Status: DC

## 2019-03-09 NOTE — Telephone Encounter (Signed)
Patient since Monday has had progressively worsening cough with myalgias and subjective fever.  Would like patient to have nasal swab COVID test done.  Please get her scheduled today or tomorrow/before the weekend.  Also would you make sure that I am notified of the result.  Recently patient had testing but I was not notified of result.

## 2019-03-09 NOTE — Telephone Encounter (Signed)
Pt has been scheduled for covid-19 testing.   Pt was referred by: Mackie Pai PA

## 2019-03-09 NOTE — Telephone Encounter (Signed)
Pt has been scheduled for covid-19 testing.  °

## 2019-03-09 NOTE — Progress Notes (Signed)
Patient ID: Laura Obrien, female   DOB: July 11, 1961, 57 y.o.   MRN: [id number]  Virtual Visit via Telephone Note  I connected with Laura Obrien on 03/09/19 at 10:20 AM EDT by telephone and verified that I am speaking with the correct person using two identifiers.  Location: Patient: home Provider: office  Pt blood pressure machine is not working.   I discussed the limitations, risks, security and privacy concerns of performing an evaluation and management service by telephone and the availability of in person appointments. I also discussed with the patient that there may be a patient responsible charge related to this service. The patient expressed understanding and agreed to proceed.   History of Present Illness: Pt states since Monday she developed cough mild at first but has worsened. She had decreased appetite, mild fatigued and myalgia over body. Pt states cough is dry. No wheezing. Not short of breath. Pt has not checked temp.Some sweating. Feels little warm. No chills. Pt took friend to doctor who has lung issues and cough. No known contacts to persons with covid.  Pt been staying at home except for trip to grocery store.   Observations/Objective: General-no acute distress, pleasant, normal speech. Oriented. Coughs intermittent during visit.  Assessment and Plan: Patient has some symptoms that are concerning for bronchitis versus possible COVID.  She does have intermittent cough throughout the entire telephone visit.  Also reporting some diffuse mild myalgias with warm feeling and sweats.  Will prescribe azithromycin antibiotic for bronchitis and prescribed benzonatate for cough.  Can take Tylenol for fever.  Stressed to patient to make sure she stays well-hydrated and eat some although she does have decreased appetite.  We will arrange COVID testing through [facility].  They can get her scheduled for drive-through testing site today or tomorrow.  Follow-up on Monday to see how  patient is doing.  Hopefully COVID test will be negative.  Follow Up Instructions:    I discussed the assessment and treatment plan with the patient. The patient was provided an opportunity to ask questions and all were answered. The patient agreed with the plan and demonstrated an understanding of the instructions.   The patient was advised to call back or seek an in-person evaluation if the symptoms worsen or if the condition fails to improve as anticipated.  I provided 20 minutes of non-face-to-face time during this encounter.   Mackie Pai, PA-C

## 2019-03-09 NOTE — Addendum Note (Signed)
Addended by: Denman George on: 03/09/2019 02:18 PM   Modules accepted: Orders

## 2019-03-09 NOTE — Patient Instructions (Signed)
Patient has some symptoms that are concerning for bronchitis versus possible COVID.  She does have intermittent cough throughout the entire telephone visit.  Also reporting some diffuse mild myalgias with warm feeling and sweats.  Will prescribe azithromycin antibiotic for bronchitis and prescribed benzonatate for cough.  Can take Tylenol for fever.  Stressed to patient to make sure she stays well-hydrated and eat some although she does have decreased appetite.  We will arrange COVID testing through [facility].  They can get her scheduled for drive-through testing site today or tomorrow.  Follow-up on Monday to see how patient is doing.  Hopefully COVID test will be negative.

## 2019-03-10 ENCOUNTER — Other Ambulatory Visit: Payer: Medicare Other

## 2019-03-11 LAB — NOVEL CORONAVIRUS, NAA: SARS-CoV-2, NAA: DETECTED — AB

## 2019-03-13 ENCOUNTER — Ambulatory Visit: Payer: Self-pay

## 2019-03-13 ENCOUNTER — Other Ambulatory Visit: Payer: Self-pay

## 2019-03-13 ENCOUNTER — Ambulatory Visit (INDEPENDENT_AMBULATORY_CARE_PROVIDER_SITE_OTHER): Payer: Medicare Other | Admitting: Medical

## 2019-03-13 ENCOUNTER — Encounter: Payer: Self-pay | Admitting: Medical

## 2019-03-13 ENCOUNTER — Ambulatory Visit: Payer: Medicare Other | Admitting: Medical

## 2019-03-13 DIAGNOSIS — J40 Bronchitis, not specified as acute or chronic: Secondary | ICD-10-CM | POA: Diagnosis not present

## 2019-03-13 DIAGNOSIS — U071 COVID-19: Secondary | ICD-10-CM | POA: Diagnosis not present

## 2019-03-13 MED ORDER — AZITHROMYCIN 250 MG PO TABS: ORAL_TABLET | ORAL | 0 refills | Status: DC

## 2019-03-13 MED ORDER — BENZONATATE 100 MG PO CAPS: 100.0000 mg | ORAL_CAPSULE | Freq: Three times a day (TID) | ORAL | 0 refills | Status: DC | PRN

## 2019-03-13 NOTE — Patient Instructions (Signed)
Patient does have recent bronchitis type symptoms and I did have her tested for COVID.  Test came back 4 days ago and it appears someone try to notify her but never got through to her directly.  Patient continues to have cough but not severe enough to disrupt her sleep.  She reports no shortness of breath or wheezing.  No fever reported.  Does get some occasional body aches and has mild fatigue.  Patient was stat and crying sounds during the interview.  I tried to educate her regarding her low code risk for complication.  Also educated her on signs and symptoms to watch out for indicating need for further evaluation and possible ED work-up.  Patient seemed to come down towards the end of counseling.  Follow-up with patient this coming Thursday to make sure she is clinically improved.  Advised her that if she has worsening symptoms particularly shortness of breath then contact our office tomorrow.

## 2019-03-13 NOTE — Progress Notes (Signed)
   Subjective:    Patient ID: Laura Obrien, female    DOB: December 04, 1960, 58 y.o.   MRN: [id number]  HPI  Virtual Visit via Telephone Note  I connected with [name] on 03/13/19 at  2:00 PM EDT by telephone and verified that I am speaking with the correct person using two identifiers.  Location: Patient: home Provider: home   I discussed the limitations, risks, security and privacy concerns of performing an evaluation and management service by telephone and the availability of in person appointments. I also discussed with the patient that there may be a patient responsible charge related to this service. The patient expressed understanding and agreed to proceed.   History of Present Illness:  Pt state her cough is worse than last visit. No sob or wheezing when ambulates. Pt states cough is not keeping her up at night. Pt not runnings fevers. She has intermittent achiness to body. Pt does admit little fatigued.  No leg pain reported.  Pt did get tested     Observations/Objective: General- no acute distress. Pleasant but concerned over covid dx. Speaking in a normal pattern that was unlabored.    Assessment and Plan: Patient does have recent bronchitis type symptoms and I did have her tested for COVID.  Test came back 4 days ago and it appears someone try to notify her but never got through to her directly.  Patient continues to have cough but not severe enough to disrupt her sleep.  She reports no shortness of breath or wheezing.  No fever reported.  Does get some occasional body aches and has mild fatigue.  Patient was stat and crying sounds during the interview.  I tried to educate her regarding her low code risk for complication.  Also educated her on signs and symptoms to watch out for indicating need for further evaluation and possible ED work-up.  Patient seemed to come down towards the end of counseling.  Follow-up with patient this coming Thursday to make sure she  is clinically improved.  Advised her that if she has worsening symptoms particularly shortness of breath then contact our office tomorrow.  Follow Up Instructions:    I discussed the assessment and treatment plan with the patient. The patient was provided an opportunity to ask questions and all were answered. The patient agreed with the plan and demonstrated an understanding of the instructions.   The patient was advised to call back or seek an in-person evaluation if the symptoms worsen or if the condition fails to improve as anticipated.  I provided 15  minutes of non-face-to-face time during this encounter.   Mackie Pai, PA-C    Review of Systems     Objective:   Physical Exam        Assessment & Plan:

## 2019-03-13 NOTE — Telephone Encounter (Signed)
Pt called stating that she had someone at the drive through told that they could not get her the pulse oximeter that E Saguier PA  told her to purchase today. NT reviewed discharge summary.  Call made to office for clarification. Pt instructed that someone would have to go into the pharmacy to purchase item. It would be located near the BP cuffs. Also staff at the pharmacy could help. Pt verbalized understanding.   Reason for Disposition . [1] Caller requesting NON-URGENT health information AND [2] PCP's office is the best resource  Answer Assessment - Initial Assessment Questions 1. REASON FOR CALL or QUESTION: "What is your reason for calling today?" or "How can I best help you?" or "What question do you have that I can help answer?"     Pt requesting information on pulse oximeter.  Protocols used: INFORMATION ONLY CALL-A-AH

## 2019-03-15 ENCOUNTER — Telehealth: Payer: Self-pay

## 2019-03-15 NOTE — Telephone Encounter (Deleted)
Pt wants

## 2019-03-15 NOTE — Telephone Encounter (Signed)
Error

## 2019-03-20 ENCOUNTER — Ambulatory Visit: Payer: Self-pay | Admitting: *Deleted

## 2019-03-20 NOTE — Telephone Encounter (Signed)
  Reason for Disposition . Health Information question, no triage required and triager able to answer question  Protocols used: INFORMATION ONLY CALL-A-AH

## 2019-03-20 NOTE — Telephone Encounter (Addendum)
Summary: covid question    Pt is wanting to know if she needs to get retested in 14 days. She tested positive for covid 19.       Had a dry cough but no other symptoms. Stated that she had the covid-19 test done and it was positive. It has been a week since quarantined and she understands that she needs to continue for another week.  Then let her provider know if she is better or having more symptoms.  She wants to know if she needs to be retested. She voiced understanding. right now only wants to visit her mom, who has demential and lives with her brother. Advised her to stay quarantine, stay hydrated, get her exercise in the yard and to take her vitamins and other medications as prescribe. Also make sure she is cleaning surfaces and sanitizing. She voiced understanding. Routing to LB at [facility]  for review and recommendation.

## 2019-03-21 NOTE — Telephone Encounter (Signed)
Pt tested positive for covid and still has residual cough. Will get her scheduled for follow up with me on Monday or Tuesday afternoon. Let pt know will discuss how she is feeling and may get her retested to see if now negative.

## 2019-03-22 NOTE — Telephone Encounter (Signed)
Pt scheduled 03/28/19

## 2019-03-28 ENCOUNTER — Ambulatory Visit (INDEPENDENT_AMBULATORY_CARE_PROVIDER_SITE_OTHER): Payer: Medicare Other | Admitting: Medical

## 2019-03-28 ENCOUNTER — Encounter: Payer: Self-pay | Admitting: Medical

## 2019-03-28 ENCOUNTER — Other Ambulatory Visit: Payer: Self-pay

## 2019-03-28 ENCOUNTER — Telehealth: Payer: Self-pay | Admitting: Medical

## 2019-03-28 VITALS — BP 137/71 | HR 83 | Ht 66.0 in | Wt 155.0 lb

## 2019-03-28 DIAGNOSIS — R059 Cough, unspecified: Secondary | ICD-10-CM

## 2019-03-28 DIAGNOSIS — Z20822 Contact with and (suspected) exposure to covid-19: Secondary | ICD-10-CM

## 2019-03-28 DIAGNOSIS — U071 COVID-19: Secondary | ICD-10-CM | POA: Diagnosis not present

## 2019-03-28 DIAGNOSIS — R05 Cough: Secondary | ICD-10-CM | POA: Diagnosis not present

## 2019-03-28 NOTE — Telephone Encounter (Signed)
Left voicemail for patient to return call to schedule COVID 19 test.  Test ordered. 

## 2019-03-28 NOTE — Patient Instructions (Signed)
Much better clinically. Rare occasional cough about one time a day. Will go ahead and repeat covid test. Placed order and want you to get tested tomorrow.  Want you to stay at home pending result of test. Expect negative result and then you will be cleared to resume normal activities/end home isolation.  Follow up prn or as regularly schedule.

## 2019-03-28 NOTE — Addendum Note (Signed)
Addended by: Denyce Robert on: 03/28/2019 03:41 PM   Modules accepted: Orders

## 2019-03-28 NOTE — Telephone Encounter (Signed)
Pt has recent covid positive infection 2 weeks ago. Now better but residual cough. I want her retested to confirm now negative. Can she be scheduled for tomorrow.

## 2019-03-28 NOTE — Telephone Encounter (Signed)
Patient called to schedule testing- patient has been scheduled

## 2019-03-28 NOTE — Progress Notes (Signed)
Subjective:    Patient ID: Laura Obrien, female    DOB: [birth year], 58 y.o.   MRN: [id number]  HPI  Virtual Visit via Telephone Note  I connected with Laura Obrien on 03/28/19 at  2:00 PM EDT by telephone and verified that I am speaking with the correct person using two identifiers.  Location: Patient: home Provider: home     I discussed the limitations, risks, security and privacy concerns of performing an evaluation and management service by telephone and the availability of in person appointments. I also discussed with the patient that there may be a patient responsible charge related to this service. The patient expressed understanding and agreed to proceed.   History of Present Illness:   Pt states she now feels better after covid infection. She states felt better now for 10 days. Pt had cough that was severe that kept her up. Early on muscle aches. Now only has rare occasional cough one time a day. No fever, no chills, no sob, no wheezing or body ache.   Observations/Objective: General- no acute, pleasant, oriented. Speaking normal.  Assessment and Plan: Much better clinically. Rare occasional cough about one time a day. Will go ahead and repeat covid test. Placed order and want you to get tested tomorrow.  Want you to stay at home pending result of test. Expect negative result and then you will be cleared to resume normal activities/end home isolation.  Follow up prn or as regularly schedule.  Mackie Pai, PA-C  Follow Up Instructions:    I discussed the assessment and treatment plan with the patient. The patient was provided an opportunity to ask questions and all were answered. The patient agreed with the plan and demonstrated an understanding of the instructions.   The patient was advised to call back or seek an in-person evaluation if the symptoms worsen or if the condition fails to improve as anticipated.  I provided 15 minutes of non-face-to-face  time during this encounter.   Mackie Pai, PA-C   Review of Systems  Constitutional: Negative for chills, fatigue and fever.  HENT: Negative for congestion, drooling and hearing loss.   Respiratory: Negative for cough, chest tightness, shortness of breath and wheezing.        Rare random cough.  Cardiovascular: Negative for chest pain and palpitations.  Gastrointestinal: Negative for abdominal pain, constipation, nausea and rectal pain.  Genitourinary: Negative for dysuria.  Musculoskeletal: Negative for back pain.  Skin: Negative for rash.  Neurological: Negative for dizziness, syncope, weakness and headaches.  Hematological: Negative for adenopathy. Does not bruise/bleed easily.  Psychiatric/Behavioral: Negative for behavioral problems and confusion.   Past Medical History:  Diagnosis Date  . Diabetes mellitus without complication ([place])   . Fibroid, uterine   . Fibromyalgia   . Hypertension      Social History   Socioeconomic History  . Marital status: Single    Spouse name: Not on file  . Number of children: Not on file  . Years of education: Not on file  . Highest education level: Not on file  Occupational History  . Not on file  Social Needs  . Financial resource strain: Not on file  . Food insecurity    Worry: Not on file    Inability: Not on file  . Transportation needs    Medical: Not on file    Non-medical: Not on file  Tobacco Use  . Smoking status: Never Smoker  . Smokeless tobacco: Never Used  Substance and Sexual  Activity  . Alcohol use: No  . Drug use: No  . Sexual activity: Not on file  Lifestyle  . Physical activity    Days per week: Not on file    Minutes per session: Not on file  . Stress: Not on file  Relationships  . Social Herbalist on phone: Not on file    Gets together: Not on file    Attends religious service: Not on file    Active member of club or organization: Not on file    Attends meetings of clubs or  organizations: Not on file    Relationship status: Not on file  . Intimate partner violence    Fear of current or ex partner: Not on file    Emotionally abused: Not on file    Physically abused: Not on file    Forced sexual activity: Not on file  Other Topics Concern  . Not on file  Social History Narrative  . Not on file    Past Surgical History:  Procedure Laterality Date  . ABDOMINAL HYSTERECTOMY    . BREAST SURGERY     benign mass removed/ left breast    Family History  Problem Relation Age of Onset  . Cancer Mother        breast  . Hypertension Mother   . Cancer Maternal Grandmother        lung  . Hypertension Father   . Hypertension Sister   . Hypertension Brother     No Known Allergies  Current Outpatient Medications on File Prior to Visit  Medication Sig Dispense Refill  . azithromycin (ZITHROMAX) 250 MG tablet Take 2 tablets by mouth on day 1, followed by 1 tablet by mouth daily for 4 days. 6 tablet 0  . azithromycin (ZITHROMAX) 250 MG tablet Take 2 tablets by mouth on day 1, followed by 1 tablet by mouth daily for 4 days. 6 tablet 0  . benzonatate (TESSALON) 100 MG capsule Take 1 capsule (100 mg total) by mouth 3 (three) times daily as needed for cough. 30 capsule 0  . benzonatate (TESSALON) 100 MG capsule Take 1 capsule (100 mg total) by mouth 3 (three) times daily as needed for cough. 30 capsule 0  . Blood Glucose Monitoring Suppl (GLUCOCOM BLOOD GLUCOSE MONITOR) DEVI Use daily to check blood sugar.    . Clobetasol Prop Emollient Base (CLOBETASOL PROPIONATE E) 0.05 % emollient cream Apply 1 application topically 1 day or 1 dose. Use q hs daily x 6 wks, then go to 2x/wk 30 g 5  . diclofenac (VOLTAREN) 75 MG EC tablet Take 1 tablet (75 mg total) by mouth 2 (two) times daily. 30 tablet 0  . fluticasone (FLONASE) 50 MCG/ACT nasal spray Place 2 sprays into both nostrils daily. 16 g 0  . glucose blood test strip Check blood sugar twice daily,. (E11.9) 100 each 12  .  metaxalone (SKELAXIN) 800 MG tablet 1 tab po q  8 hours if needed for back muscle pain 30 tablet 0  . metFORMIN (GLUCOPHAGE) 500 MG tablet Take 1 tablet (500 mg total) by mouth daily with breakfast. 90 tablet 1  . Milnacipran (SAVELLA) 50 MG TABS tablet Take 1 tablet (50 mg total) by mouth 2 (two) times daily. 60 tablet 0  . ONETOUCH VERIO test strip TEST EVERY DAY AT ALTERNATING TIMES 100 each 12  . predniSONE (STERAPRED UNI-PAK 21 TAB) 5 MG (21) TBPK tablet Taper as directed 21 tablet 0  .  traZODone (DESYREL) 50 MG tablet Take 0.5-1 tablets (25-50 mg total) by mouth at bedtime as needed for sleep. 14 tablet 0   Current Facility-Administered Medications on File Prior to Visit  Medication Dose Route Frequency Provider Last Rate Last Dose  . chlorhexidine (HIBICLENS) 4 % liquid   Topical Daily Donnamae Jude, MD        BP 137/71   Pulse 83   Ht 5\' 6"  (1.676 m)   Wt 155 lb (70.3 kg)   BMI 25.02 kg/m       Objective:   Physical Exam        Assessment & Plan:

## 2019-03-29 ENCOUNTER — Other Ambulatory Visit: Payer: Medicare Other

## 2019-03-29 DIAGNOSIS — Z20822 Contact with and (suspected) exposure to covid-19: Secondary | ICD-10-CM

## 2019-04-01 ENCOUNTER — Telehealth: Payer: Self-pay | Admitting: Medical

## 2019-04-01 NOTE — Telephone Encounter (Signed)
Opened to review 

## 2019-04-01 NOTE — Telephone Encounter (Signed)
Will you schedule pt for appointment  with me for early September so she can update pneumonia vaccine,tdap and flu vaccine. Also will put mammogram and dexascan to get done that day.

## 2019-04-03 NOTE — Telephone Encounter (Signed)
Pt has appointment already scheduled for 05/25/19

## 2019-04-04 LAB — NOVEL CORONAVIRUS, NAA: SARS-CoV-2, NAA: NOT DETECTED

## 2019-04-05 ENCOUNTER — Telehealth: Payer: Self-pay | Admitting: Medical

## 2019-04-05 NOTE — Telephone Encounter (Signed)
Charted in result notes. 

## 2019-04-05 NOTE — Telephone Encounter (Signed)
Pt returned call.  Copied from [place] [id number]. Topic: Quick Communication - Lab Results (Clinic Use ONLY) >> Apr 05, 2019 10:56 AM Hinton Dyer, CMA wrote: Called patient to inform them of 04/04/19 lab results. When patient returns call, triage nurse may disclose results.

## 2019-04-10 ENCOUNTER — Telehealth: Payer: Self-pay

## 2019-04-10 NOTE — Telephone Encounter (Signed)
Copied from [place] [phone]. Topic: General - Other >> Apr 10, 2019  8:38 AM [name] A wrote: Patient stated that she needs the provider to sign paperwork that was signed by a nurse she stated and would like a callback.

## 2019-04-10 NOTE — Telephone Encounter (Signed)
Do you have paperwork? 

## 2019-04-10 NOTE — Telephone Encounter (Signed)
January 23, 2019 disability paperwork filled out. See that note. Not sure if t Ivin Booty scanned that to epic. But looks like she scanned the form.  But not sure what form she is talking about.

## 2019-04-10 NOTE — Telephone Encounter (Signed)
I don't remember seeing any paper work on her recently. What is nature of paperwork. I have been spending time after hours filling out all types of forms for different patients. And don't remember any recent forms on her? When was this form sent?

## 2019-04-10 NOTE — Telephone Encounter (Signed)
Pt states paperwork that was filled out before has to have MD sign it. I told pt to have the paperwork sent back to [place] and we will get it taken care of. I states she is still coughing and would like something sent in for it. Pt does not want the Benzonatate.

## 2019-04-17 NOTE — Telephone Encounter (Signed)
Left pt a message to call back to schedule appointment.  

## 2019-04-17 NOTE — Telephone Encounter (Signed)
Pt calling back this am as she had not heard anything back from her phone call. 1.   Pt states her FMLA paperwork must be signed by a dr, not a PA.  2. Pt states she still has a dry cough, requesting cough medication, and not the tessalon pearls, as they did not help her.

## 2019-04-17 NOTE — Telephone Encounter (Signed)
Ok. This is the form I signed weeks ago. We need to find it? Has it been scanned or does she have copy of her paperwork. I need to see if Dr.Lowne willing to sign. Is this fmla paperwork or disability paperwork. Never seen demand MD co sign  paperwork for fmla paperwork. So is this disability paperwork. That makes more sense.  If disability will need to see if Dr. Etter Sjogren or Dr. Charlett Blake agrees to [place]. We don't do a lot of disability paperwork. I just filled hers out since this was renewal type form. She had a lot of documentation about her disablity from prior evaluations.  If she has persisting cough recommend virtual visit as I saw her last 3 weeks ago and need to talk with her before rx of narcotic based cough syrup.

## 2019-04-18 ENCOUNTER — Ambulatory Visit (INDEPENDENT_AMBULATORY_CARE_PROVIDER_SITE_OTHER): Payer: Medicare Other | Admitting: Medical

## 2019-04-18 ENCOUNTER — Encounter: Payer: Self-pay | Admitting: Medical

## 2019-04-18 ENCOUNTER — Telehealth: Payer: Self-pay | Admitting: Medical

## 2019-04-18 ENCOUNTER — Other Ambulatory Visit: Payer: Self-pay | Admitting: Medical

## 2019-04-18 VITALS — BP 120/71 | HR 85 | Ht 66.0 in | Wt 145.0 lb

## 2019-04-18 DIAGNOSIS — R05 Cough: Secondary | ICD-10-CM | POA: Diagnosis not present

## 2019-04-18 DIAGNOSIS — J309 Allergic rhinitis, unspecified: Secondary | ICD-10-CM

## 2019-04-18 DIAGNOSIS — R059 Cough, unspecified: Secondary | ICD-10-CM

## 2019-04-18 MED ORDER — LEVOCETIRIZINE DIHYDROCHLORIDE 5 MG PO TABS: 5.0000 mg | ORAL_TABLET | Freq: Every evening | ORAL | 0 refills | Status: DC

## 2019-04-18 MED ORDER — FLUTICASONE PROPIONATE 50 MCG/ACT NA SUSP: 2.0000 | Freq: Every day | NASAL | 0 refills | Status: DC

## 2019-04-18 NOTE — Patient Instructions (Signed)
Patient had recent mild intermittent rare cough.  She describes the cough as occurring about twice a day and is dry.  She is not reporting any fevers, chills, shortness of breath, wheezing or any pedal edema.  She tries medications over-the-counter and did not help.  She also does not want to try benzonatate anymore.  Cough is minimal and I do not want to prescribe narcotic-based medication.  I think cough might represent allergies.  So so we will prescribe Xyzal antihistamine and Flonase nasal spray.  She has been on Flonase in the past.  Patient did have COVID about 1 month or more in the past.  Her symptoms did improve and her COVID test came back negative on repeat.  Advised patient that if her symptoms change or worsen let us know.  If after 5 to 7 days if cough persisting despite use of the above medications and might consider getting chest x-ray.  Discussed with patient that I did get Dr. Etter Sjogren mild to cosign her disability paperwork.  This paperwork was reviewed month or so ago.  Extensive records in the chart stating that she was evaluated by specialist and determined to be disabled.  This is the basis of our judgment.  Follow-up date to be determined based on patient's update in 5 to 7 days.

## 2019-04-18 NOTE — Telephone Encounter (Signed)
I got Dr. Etter Sjogren to cosign pt paperwork. Explained filled out paperwork based on extensive records in chart where was determined to be disabled.  Please fax those or mail. Keep copy.

## 2019-04-18 NOTE — Progress Notes (Signed)
Subjective:    Patient ID: Laura Obrien, female    DOB: [birth year], 58 y.o.   MRN: [id number]  HPI  Virtual Visit via Telephone Note  I connected with Laura Obrien on 04/18/19 at  3:20 PM EDT by telephone and verified that I am speaking with the correct person using two identifiers.  Location: Patient: home Provider: office  Pt sugar 109.   I discussed the limitations, risks, security and privacy concerns of performing an evaluation and management service by telephone and the availability of in person appointments. I also discussed with the patient that there may be a patient responsible charge related to this service. The patient expressed understanding and agreed to proceed.   History of Present Illness:  Pt states she has occasional cough. She states just cough just 2 times a day. Pt tried some otc sinus med for cough. She does not want to use benzonatate. She tried generic allergy med.   Pt had no sneezing or itchy eyes. Pt has no wheezing. No fevers, no chills or sweats.  Pt to retested for covid and her came back negative after testing positive one month ago.       Follow Up Instructions:    I discussed the assessment and treatment plan with the patient. The patient was provided an opportunity to ask questions and all were answered. The patient agreed with the plan and demonstrated an understanding of the instructions.   The patient was advised to call back or seek an in-person evaluation if the symptoms worsen or if the condition fails to improve as anticipated.  I provided 15 minutes of non-face-to-face time during this encounter.   Mackie Pai, PA-C   Review of Systems  Constitutional: Negative for chills, fatigue and fever.  HENT: Negative for congestion, postnasal drip, sinus pain, sneezing and tinnitus.   Respiratory: Positive for cough. Negative for chest tightness, shortness of breath and wheezing.        Very random dry cough.   Cardiovascular: Negative for chest pain and palpitations.  Gastrointestinal: Negative for abdominal pain.  Musculoskeletal: Negative for back pain.  Skin: Negative for rash.  Neurological: Negative for dizziness and headaches.  Hematological: Negative for adenopathy. Does not bruise/bleed easily.  Psychiatric/Behavioral: Negative for behavioral problems and confusion.    Past Medical History:  Diagnosis Date  . Diabetes mellitus without complication ([place])   . Fibroid, uterine   . Fibromyalgia   . Hypertension      Social History   Socioeconomic History  . Marital status: Single    Spouse name: Not on file  . Number of children: Not on file  . Years of education: Not on file  . Highest education level: Not on file  Occupational History  . Not on file  Social Needs  . Financial resource strain: Not on file  . Food insecurity    Worry: Not on file    Inability: Not on file  . Transportation needs    Medical: Not on file    Non-medical: Not on file  Tobacco Use  . Smoking status: Never Smoker  . Smokeless tobacco: Never Used  Substance and Sexual Activity  . Alcohol use: No  . Drug use: No  . Sexual activity: Not on file  Lifestyle  . Physical activity    Days per week: Not on file    Minutes per session: Not on file  . Stress: Not on file  Relationships  . Social Herbalist on  phone: Not on file    Gets together: Not on file    Attends religious service: Not on file    Active member of club or organization: Not on file    Attends meetings of clubs or organizations: Not on file    Relationship status: Not on file  . Intimate partner violence    Fear of current or ex partner: Not on file    Emotionally abused: Not on file    Physically abused: Not on file    Forced sexual activity: Not on file  Other Topics Concern  . Not on file  Social History Narrative  . Not on file    Past Surgical History:  Procedure Laterality Date  . ABDOMINAL  HYSTERECTOMY    . BREAST SURGERY     benign mass removed/ left breast    Family History  Problem Relation Age of Onset  . Cancer Mother        breast  . Hypertension Mother   . Cancer Maternal Grandmother        lung  . Hypertension Father   . Hypertension Sister   . Hypertension Brother     No Known Allergies  Current Outpatient Medications on File Prior to Visit  Medication Sig Dispense Refill  . azithromycin (ZITHROMAX) 250 MG tablet Take 2 tablets by mouth on day 1, followed by 1 tablet by mouth daily for 4 days. 6 tablet 0  . azithromycin (ZITHROMAX) 250 MG tablet Take 2 tablets by mouth on day 1, followed by 1 tablet by mouth daily for 4 days. 6 tablet 0  . benzonatate (TESSALON) 100 MG capsule Take 1 capsule (100 mg total) by mouth 3 (three) times daily as needed for cough. 30 capsule 0  . benzonatate (TESSALON) 100 MG capsule Take 1 capsule (100 mg total) by mouth 3 (three) times daily as needed for cough. 30 capsule 0  . Blood Glucose Monitoring Suppl (GLUCOCOM BLOOD GLUCOSE MONITOR) DEVI Use daily to check blood sugar.    . Clobetasol Prop Emollient Base (CLOBETASOL PROPIONATE E) 0.05 % emollient cream Apply 1 application topically 1 day or 1 dose. Use q hs daily x 6 wks, then go to 2x/wk 30 g 5  . diclofenac (VOLTAREN) 75 MG EC tablet Take 1 tablet (75 mg total) by mouth 2 (two) times daily. 30 tablet 0  . fluticasone (FLONASE) 50 MCG/ACT nasal spray Place 2 sprays into both nostrils daily. 16 g 0  . glucose blood test strip Check blood sugar twice daily,. (E11.9) 100 each 12  . metaxalone (SKELAXIN) 800 MG tablet 1 tab po q  8 hours if needed for back muscle pain 30 tablet 0  . metFORMIN (GLUCOPHAGE) 500 MG tablet Take 1 tablet (500 mg total) by mouth daily with breakfast. 90 tablet 1  . Milnacipran (SAVELLA) 50 MG TABS tablet Take 1 tablet (50 mg total) by mouth 2 (two) times daily. 60 tablet 0  . ONETOUCH VERIO test strip TEST EVERY DAY AT ALTERNATING TIMES 100 each 12   . predniSONE (STERAPRED UNI-PAK 21 TAB) 5 MG (21) TBPK tablet Taper as directed 21 tablet 0  . traZODone (DESYREL) 50 MG tablet Take 0.5-1 tablets (25-50 mg total) by mouth at bedtime as needed for sleep. 14 tablet 0   Current Facility-Administered Medications on File Prior to Visit  Medication Dose Route Frequency Provider Last Rate Last Dose  . chlorhexidine (HIBICLENS) 4 % liquid   Topical Daily Donnamae Jude, MD  BP 120/71   Pulse 85   Ht 5\' 6"  (1.676 m)   Wt 145 lb (65.8 kg)   BMI 23.40 kg/m       Objective:   Physical Exam   General- no acute distress. Pleasant, alert, normal speech. She cough one time in 15 minute time frame.        Assessment & Plan:  Patient had recent mild intermittent rare cough.  She describes the cough as occurring about twice a day and is dry.  She is not reporting any fevers, chills, shortness of breath, wheezing or any pedal edema.  She tries medications over-the-counter and did not help.  She also does not want to try benzonatate anymore.  Cough is minimal and I do not want to prescribe narcotic-based medication.  I think cough might represent allergies.  So so we will prescribe Xyzal antihistamine and Flonase nasal spray.  She has been on Flonase in the past.  Patient did have COVID about 1 month or more in the past.  Her symptoms did improve and her COVID test came back negative on repeat.  Advised patient that if her symptoms change or worsen let us know.  If after 5 to 7 days if cough persisting despite use of the above medications and might consider getting chest x-ray.  Discussed with patient that I did get Dr. Etter Sjogren mild to cosign her disability paperwork.  This paperwork was reviewed month or so ago.  Extensive records in the chart stating that she was evaluated by specialist and determined to be disabled.  This is the basis of our judgment.  Follow-up date to be determined based on patient's update in 5 to 7 days.  Mackie Pai,  PA-C

## 2019-04-19 ENCOUNTER — Telehealth: Payer: Self-pay

## 2019-04-19 NOTE — Telephone Encounter (Signed)
Form faxed

## 2019-04-19 NOTE — Telephone Encounter (Signed)
Copied from [place] [id number]. Topic: General - Other >> Apr 10, 2019  8:38 AM [name] A wrote: Patient stated that she needs the provider to sign paperwork that was signed by a nurse she stated and would like a callback. >> Apr 18, 2019  3:58 PM Leward Quan A wrote: Patient called to leave fax # where her tax paper work need to be sent to. Fax# [phone]

## 2019-05-16 ENCOUNTER — Other Ambulatory Visit: Payer: Self-pay | Admitting: Medical

## 2019-05-24 ENCOUNTER — Other Ambulatory Visit: Payer: Self-pay

## 2019-05-24 ENCOUNTER — Encounter: Payer: Self-pay | Admitting: Medical

## 2019-05-24 ENCOUNTER — Ambulatory Visit (INDEPENDENT_AMBULATORY_CARE_PROVIDER_SITE_OTHER): Payer: Medicare Other | Admitting: Medical

## 2019-05-24 VITALS — BP 128/78 | HR 87 | Ht 66.0 in | Wt 150.0 lb

## 2019-05-24 DIAGNOSIS — E119 Type 2 diabetes mellitus without complications: Secondary | ICD-10-CM

## 2019-05-24 DIAGNOSIS — M797 Fibromyalgia: Secondary | ICD-10-CM

## 2019-05-24 DIAGNOSIS — G4700 Insomnia, unspecified: Secondary | ICD-10-CM | POA: Diagnosis not present

## 2019-05-24 NOTE — Patient Instructions (Addendum)
For diabetes, continue low sugar diet, exercise and metformin. Will put in future cmp, lipid panel and a1c to be done within next month. If a1c worse on review, then may need to increase dose metformin.  For insomnia, continue trazadone.  For fibromyalgia continue savella.  For allergic rhinitis history continue xyzal and flonase.  Follow up date to be detemined after lab review.

## 2019-05-24 NOTE — Progress Notes (Signed)
   Subjective:    Patient ID: Laura Obrien, female    DOB: 1961-08-28, 58 y.o.   MRN: [id number]  HPI   Virtual Visit via Telephone Note  I connected with Laura Obrien on 05/24/19 at  3:00 PM EDT by telephone and verified that I am speaking with the correct person using two identifiers.  Location: Patient: home Provider: office.   I discussed the limitations, risks, security and privacy concerns of performing an evaluation and management service by telephone and the availability of in person appointments. I also discussed with the patient that there may be a patient responsible charge related to this service. The patient expressed understanding and agreed to proceed.   History of Present Illness:   Pt states her sugar today was 120 after she ate. Pt last a1c ws 6.5 7 months ago. She has been walking some daily. She tries to walk about 2 miles a day. Pt is taking metformin. She states takes with no side effects.  Pt has allergies and those symptoms controlled with xyzal and flonase.  Pt has fibromyalgia. Savella does help.  Pt has hx of insomnia. Reports sleeps well with Savella.  Pt declines flu vaccine.(pt states she had side effect of muscle pain). So since she has declined every year.     Observations/Objective: General- no acute distress, pleasant, oriented. Normal speech.  Assessment and Plan:   Follow Up Instructions:    I discussed the assessment and treatment plan with the patient. The patient was provided an opportunity to ask questions and all were answered. The patient agreed with the plan and demonstrated an understanding of the instructions.   The patient was advised to call back or seek an in-person evaluation if the symptoms worsen or if the condition fails to improve as anticipated.  I provided 15 minutes of non-face-to-face time during this encounter.   Mackie Pai, PA-C   Review of Systems  Constitutional: Negative for chills and  unexpected weight change.  Respiratory: Negative for cough, chest tightness, shortness of breath and wheezing.   Cardiovascular: Negative for chest pain and palpitations.  Musculoskeletal: Negative for back pain, myalgias and neck stiffness.       Fibromyalgia pain controlled.  Neurological: Negative for dizziness, speech difficulty, weakness, numbness and headaches.  Hematological: Negative for adenopathy. Does not bruise/bleed easily.  Psychiatric/Behavioral: Positive for sleep disturbance. Negative for behavioral problems, dysphoric mood and suicidal ideas. The patient is not nervous/anxious.        Sleeps better with trazadone.       Objective:   Physical Exam        Assessment & Plan:

## 2019-05-25 ENCOUNTER — Ambulatory Visit: Payer: Medicare Other | Admitting: Medical

## 2019-06-07 ENCOUNTER — Other Ambulatory Visit: Payer: Self-pay | Admitting: Medical

## 2019-06-07 NOTE — Telephone Encounter (Signed)
Patient needs a script for the One touch Veria glucose meter

## 2019-06-07 NOTE — Telephone Encounter (Signed)
Medication Refill - Medication:  Onetouch veria glucose monitor   Has the patient contacted their pharmacy?  Yes advised to call PCP.   Preferred Pharmacy (with phone number or street name):  [id number] - [place], [address] AT [address] [phone] (Phone) [phone] (Fax)   Agent: Please be advised that RX refills may take up to 3 business days. We ask that you follow-up with your pharmacy.

## 2019-06-08 ENCOUNTER — Telehealth: Payer: Self-pay

## 2019-06-08 MED ORDER — ONETOUCH VERIO W/DEVICE KIT: PACK | 0 refills | Status: AC

## 2019-06-08 NOTE — Telephone Encounter (Signed)
Copied from [place] [phone]. Topic: General - Inquiry >> Jun 07, 2019  8:00 AM Richardo Priest, NT wrote: Reason for CRM: Patient called in and is wanting to see if PCP can send her to physical therapy or things of that nature to help her with her back. Please advise and call back is [phone].

## 2019-06-08 NOTE — Telephone Encounter (Signed)
Spoke w/ Pt- appt scheduled w/ Percell Miller.

## 2019-06-08 NOTE — Telephone Encounter (Signed)
On my last note I don't see that we discussed back pain. Can refer to PT but ask she make appointment virtually first to discuss what exactly is going on.

## 2019-06-08 NOTE — Telephone Encounter (Signed)
Rx sent for One Touch Verio meter.

## 2019-06-09 ENCOUNTER — Other Ambulatory Visit: Payer: Self-pay

## 2019-06-12 ENCOUNTER — Telehealth: Payer: Self-pay | Admitting: Medical

## 2019-06-12 ENCOUNTER — Other Ambulatory Visit: Payer: Medicare Other

## 2019-06-12 ENCOUNTER — Encounter: Payer: Self-pay | Admitting: Medical

## 2019-06-12 ENCOUNTER — Ambulatory Visit (INDEPENDENT_AMBULATORY_CARE_PROVIDER_SITE_OTHER): Payer: Medicare Other | Admitting: Medical

## 2019-06-12 ENCOUNTER — Other Ambulatory Visit: Payer: Self-pay

## 2019-06-12 VITALS — BP 124/85 | HR 71 | Temp 96.8°F | Resp 16 | Ht 66.0 in | Wt 163.2 lb

## 2019-06-12 DIAGNOSIS — M545 Low back pain, unspecified: Secondary | ICD-10-CM

## 2019-06-12 DIAGNOSIS — E119 Type 2 diabetes mellitus without complications: Secondary | ICD-10-CM

## 2019-06-12 DIAGNOSIS — E785 Hyperlipidemia, unspecified: Secondary | ICD-10-CM | POA: Diagnosis not present

## 2019-06-12 DIAGNOSIS — M797 Fibromyalgia: Secondary | ICD-10-CM | POA: Diagnosis not present

## 2019-06-12 LAB — COMPREHENSIVE METABOLIC PANEL
ALT: 15 U/L (ref 0–35)
AST: 18 U/L (ref 0–37)
Albumin: 4.1 g/dL (ref 3.5–5.2)
Alkaline Phosphatase: 72 U/L (ref 39–117)
BUN: 10 mg/dL (ref 6–23)
CO2: 28 mEq/L (ref 19–32)
Calcium: 9.2 mg/dL (ref 8.4–10.5)
Chloride: 104 mEq/L (ref 96–112)
Creatinine, Ser: 0.75 mg/dL (ref 0.40–1.20)
GFR: 96.55 mL/min (ref >60.00)
Glucose, Bld: 91 mg/dL (ref 70–99)
Potassium: 4 mEq/L (ref 3.5–5.1)
Sodium: 141 mEq/L (ref 135–145)
Total Bilirubin: 0.5 mg/dL (ref 0.2–1.2)
Total Protein: 6.9 g/dL (ref 6.0–8.3)

## 2019-06-12 LAB — LIPID PANEL
Cholesterol: 212 mg/dL — ABNORMAL HIGH (ref 0–200)
HDL: 64.6 mg/dL (ref >39.00)
LDL Cholesterol: 136 mg/dL — ABNORMAL HIGH (ref 0–99)
NonHDL: 147.8
Total CHOL/HDL Ratio: 3
Triglycerides: 58 mg/dL (ref 0.0–149.0)
VLDL: 11.6 mg/dL (ref 0.0–40.0)

## 2019-06-12 LAB — HEMOGLOBIN A1C: Hgb A1c MFr Bld: 6.1 % (ref 4.6–6.5)

## 2019-06-12 MED ORDER — METAXALONE 800 MG PO TABS: ORAL_TABLET | ORAL | 0 refills | Status: AC

## 2019-06-12 MED ORDER — DICLOFENAC SODIUM 75 MG PO TBEC: 75.0000 mg | DELAYED_RELEASE_TABLET | Freq: Two times a day (BID) | ORAL | 0 refills | Status: DC

## 2019-06-12 MED ORDER — ATORVASTATIN CALCIUM 10 MG PO TABS: 10.0000 mg | ORAL_TABLET | Freq: Every day | ORAL | 3 refills | Status: DC

## 2019-06-12 NOTE — Telephone Encounter (Signed)
Opened to review 

## 2019-06-12 NOTE — Telephone Encounter (Signed)
rx atorvastatin sent to pharmacy. 

## 2019-06-12 NOTE — Progress Notes (Signed)
Subjective:    Patient ID: Laura Obrien, female    DOB: 10/15/1960, 58 y.o.   MRN: [id number]  HPI   Pt in for lower back pain.  She states pain off and on for weeks. She states seems to be worse after sitting down long time. No pain that radiates to her legs.  No weakness of legs or red flags symptoms.  IMPRESSION: The significant findings are likely at the L4-5 level. 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 Neural compression quite possible this level, particularly on the left. There is pronounced discogenic edema within the L4 and L5 vertebral bodies, likely associated with back pain.  In May rx'd dicolfenac for back pain and skelaxin. This helped a lot per pt. Pt declined flu vaccine today.   Review of Systems  Constitutional: Negative for chills, fatigue and fever.  Respiratory: Negative for cough, chest tightness, shortness of breath and wheezing.   Cardiovascular: Negative for chest pain and palpitations.  Gastrointestinal: Negative for abdominal pain.  Musculoskeletal: Positive for back pain.  Skin: Negative for rash.  Neurological: Negative for dizziness, tremors, speech difficulty and headaches.  Hematological: Negative for adenopathy. Does not bruise/bleed easily.  Psychiatric/Behavioral: Negative for behavioral problems, confusion, sleep disturbance and suicidal ideas. The patient is not nervous/anxious.     Past Medical History:  Diagnosis Date  . Diabetes mellitus without complication ([place])   . Fibroid, uterine   . Fibromyalgia   . Hypertension      Social History   Socioeconomic History  . Marital status: Single    Spouse name: Not on file  . Number of children: Not on file  . Years of education: Not on file  . Highest education level: Not on file  Occupational History  . Not on file  Social Needs  . Financial resource strain: Not on file  . Food insecurity    Worry: Not on  file    Inability: Not on file  . Transportation needs    Medical: Not on file    Non-medical: Not on file  Tobacco Use  . Smoking status: Never Smoker  . Smokeless tobacco: Never Used  Substance and Sexual Activity  . Alcohol use: No  . Drug use: No  . Sexual activity: Not on file  Lifestyle  . Physical activity    Days per week: Not on file    Minutes per session: Not on file  . Stress: Not on file  Relationships  . Social Herbalist on phone: Not on file    Gets together: Not on file    Attends religious service: Not on file    Active member of club or organization: Not on file    Attends meetings of clubs or organizations: Not on file    Relationship status: Not on file  . Intimate partner violence    Fear of current or ex partner: Not on file    Emotionally abused: Not on file    Physically abused: Not on file    Forced sexual activity: Not on file  Other Topics Concern  . Not on file  Social History Narrative  . Not on file    Past Surgical History:  Procedure Laterality Date  . ABDOMINAL HYSTERECTOMY    . BREAST SURGERY     benign mass removed/ left breast    Family History  Problem Relation Age of Onset  . Cancer Mother  breast  . Hypertension Mother   . Cancer Maternal Grandmother        lung  . Hypertension Father   . Hypertension Sister   . Hypertension Brother     No Known Allergies  Current Outpatient Medications on File Prior to Visit  Medication Sig Dispense Refill  . Blood Glucose Monitoring Suppl (ONETOUCH VERIO) w/Device KIT Check blood sugar once daily 1 kit 0  . Clobetasol Prop Emollient Base (CLOBETASOL PROPIONATE E) 0.05 % emollient cream Apply 1 application topically 1 day or 1 dose. Use q hs daily x 6 wks, then go to 2x/wk 30 g 5  . fluticasone (FLONASE) 50 MCG/ACT nasal spray SHAKE LIQUID AND USE 2 SPRAYS IN EACH NOSTRIL DAILY 48 g 0  . glucose blood test strip Check blood sugar twice daily,. (E11.9) 100 each 12   . levocetirizine (XYZAL) 5 MG tablet TAKE 1 TABLET(5 MG) BY MOUTH EVERY EVENING 90 tablet 0  . metFORMIN (GLUCOPHAGE) 500 MG tablet Take 1 tablet (500 mg total) by mouth daily with breakfast. 90 tablet 1  . Milnacipran (SAVELLA) 50 MG TABS tablet Take 1 tablet (50 mg total) by mouth 2 (two) times daily. 60 tablet 0  . ONETOUCH VERIO test strip TEST EVERY DAY AT ALTERNATING TIMES 100 each 12  . traZODone (DESYREL) 50 MG tablet Take 0.5-1 tablets (25-50 mg total) by mouth at bedtime as needed for sleep. 14 tablet 0   Current Facility-Administered Medications on File Prior to Visit  Medication Dose Route Frequency Provider Last Rate Last Dose  . chlorhexidine (HIBICLENS) 4 % liquid   Topical Daily Donnamae Jude, MD        BP 124/85   Pulse 71   Temp (!) 96.8 F (36 C) (Temporal)   Resp 16   Ht _0  (1.676 m)   Wt 163 lb 3.2 oz (74 kg)   SpO2 100%   BMI 26.34 kg/m         Objective:   Physical Exam  General Appearance- Not in acute distress.    Chest and Lung Exam Auscultation: Breath sounds:-Normal. Clear even and unlabored. Adventitious sounds:- No Adventitious sounds.  Cardiovascular Auscultation:Rythm - Regular, rate and rythm. Heart Sounds -Normal heart sounds.  Abdomen Inspection:-Inspection Normal.  Palpation/Perucssion: Palpation and Percussion of the abdomen reveal- Non Tender, No Rebound tenderness, No rigidity(Guarding) and No Palpable abdominal masses.  Liver:-Normal.  Spleen:- Normal.   Back Mid lumbar spine tenderness to palpation. Pain on straight leg lift. Pain on lateral movements and flexion/extension of the spine.  Lower ext neurologic  L5-S1 sensation intact bilaterally. Normal patellar reflexes bilaterally. No foot drop bilaterally.        Assessment & Plan:  For your history of low back pain with worsening symptoms recently, I did refill your diclofenac and sent in refill of Skelaxin.  You do have significant findings on prior MRI  but your symptoms do not match his findings presently.  If you have worsening signs or symptoms please let us know.  In that event will refer you to neurosurgeon.  For history of diabetes and high cholesterol, will get fasting lipid panel, CMP and A1c today.  Medication changes to be made based on results.  Follow-up date to be determined after lab review.

## 2019-06-12 NOTE — Patient Instructions (Signed)
For your history of low back pain with worsening symptoms recently, I did refill your diclofenac and sent in refill of Skelaxin.  You do have significant findings on prior MRI but your symptoms do not match his findings presently.  If you have worsening signs or symptoms please let us know.  In that event will refer you to neurosurgeon.  For history of diabetes and high cholesterol, will get fasting lipid panel, CMP and A1c today.  Medication changes to be made based on results.  Follow-up date to be determined after lab review.

## 2019-06-15 ENCOUNTER — Telehealth: Payer: Self-pay | Admitting: Medical

## 2019-06-15 MED ORDER — ATORVASTATIN CALCIUM 20 MG PO TABS: 20.0000 mg | ORAL_TABLET | Freq: Every day | ORAL | 3 refills | Status: DC

## 2019-06-15 NOTE — Telephone Encounter (Signed)
Rx atorvastatin sent to pt pharmacy. 

## 2019-06-15 NOTE — Telephone Encounter (Signed)
Copied from [place] [id number]. Topic: General - Other >> Jun 15, 2019  2:55 PM Lennox Solders wrote: Reason for CRM: pt would like list of food that will longer her chole  and want foods to avoid. Please call pt

## 2019-06-16 NOTE — Telephone Encounter (Signed)
Mail pt list

## 2019-06-19 ENCOUNTER — Ambulatory Visit (INDEPENDENT_AMBULATORY_CARE_PROVIDER_SITE_OTHER): Payer: Medicare Other | Admitting: Medical

## 2019-06-19 ENCOUNTER — Encounter: Payer: Self-pay | Admitting: Medical

## 2019-06-19 ENCOUNTER — Other Ambulatory Visit: Payer: Self-pay

## 2019-06-19 VITALS — BP 128/82

## 2019-06-19 DIAGNOSIS — E785 Hyperlipidemia, unspecified: Secondary | ICD-10-CM | POA: Diagnosis not present

## 2019-06-19 DIAGNOSIS — M545 Low back pain, unspecified: Secondary | ICD-10-CM

## 2019-06-19 MED ORDER — ATORVASTATIN CALCIUM 10 MG PO TABS: 10.0000 mg | ORAL_TABLET | Freq: Every day | ORAL | 3 refills | Status: DC

## 2019-06-19 NOTE — Progress Notes (Signed)
   Subjective:    Patient ID: Laura Obrien, female    DOB: 02/06/1961, 58 y.o.   MRN: [id number]  HPI  Virtual Visit via Telephone Note  I connected with [name] on 06/19/19 at  3:20 PM EDT by telephone and verified that I am speaking with the correct person using two identifiers.  Location: Patient: home Provider: office   I discussed the limitations, risks, security and privacy concerns of performing an evaluation and management service by telephone and the availability of in person appointments. I also discussed with the patient that there may be a patient responsible charge related to this service. The patient expressed understanding and agreed to proceed.  Pt did not check her bp or pulse.   History of Present Illness:  Pt states her back pain got better quickly with diclofenac and Skelaxin.  When I saw her on last visit pain level was reportedly about 7 out of 10.  Now with medication she states no pain at all.  Patient also has history of high cholesterol and on recent labs total cholesterol and LDL was elevated.  Patient really is not sure if she was compliant on 10 mg daily dose of atorvastatin.  Also on review does not sound like she is compliant on her low-cholesterol diet either.   Observations/Objective: General- no acute distress. Pleasant, oriented. Normal speech.  Assessment and Plan: For low back pain, advised patient continue diclofenac and Skelaxin.  Previous MRI done in February of this year did show some significant findings.  Will follow her closely and she has any symptoms correlating with those findings then would refer her to sports medicine or neurosurgeon.  For high cholesterol, recommend patient follow strict low-cholesterol diet and use the atorvastatin 10 mg dose.  Recheck fasting labs in 3 months and then decide if she needs increase in dosage.  Follow-up 3 months or as needed.  Mackie Pai, PA-C  Follow Up Instructions:    I  discussed the assessment and treatment plan with the patient. The patient was provided an opportunity to ask questions and all were answered. The patient agreed with the plan and demonstrated an understanding of the instructions.   The patient was advised to call back or seek an in-person evaluation if the symptoms worsen or if the condition fails to improve as anticipated.  I provided 15  minutes of non-face-to-face time during this encounter.   Mackie Pai, PA-C   Review of Systems  Constitutional: Negative for chills, fatigue and fever.  Respiratory: Negative for chest tightness, shortness of breath and wheezing.   Cardiovascular: Negative for chest pain and palpitations.  Gastrointestinal: Negative for abdominal pain.  Musculoskeletal: Positive for back pain.       See hpi.  Skin: Negative for rash.  Neurological: Negative for dizziness, syncope, speech difficulty, weakness, numbness and headaches ( ).  Hematological: Negative for adenopathy. Does not bruise/bleed easily.       Objective:   Physical Exam        Assessment & Plan:

## 2019-06-19 NOTE — Patient Instructions (Signed)
For low back pain, advised patient continue diclofenac and Skelaxin.  Previous MRI done in February of this year did show some significant findings.  Will follow her closely and she has any symptoms correlating with those findings then would refer her to sports medicine or neurosurgeon.  For high cholesterol, recommend patient follow strict low-cholesterol diet and use the atorvastatin 10 mg dose.  Recheck fasting labs in 3 months and then decide if she needs increase in dosage.  Follow-up 3 months or as needed.

## 2019-06-20 ENCOUNTER — Telehealth: Payer: Self-pay | Admitting: *Deleted

## 2019-06-20 ENCOUNTER — Encounter: Payer: Self-pay | Admitting: Medical

## 2019-06-20 NOTE — Telephone Encounter (Signed)
Copied from [place] [id number]. Topic: General - Other >> Jun 20, 2019  8:09 AM Carolyn Stare wrote: Pt said she was told by Percell Miller to call and give readings  Bp 129/82      Sugar 97

## 2019-06-22 ENCOUNTER — Telehealth: Payer: Self-pay

## 2019-06-22 NOTE — Telephone Encounter (Signed)
Will you ask pt what brought up her desire for diabetic shoe. Pt usually somehow get a form for [place] to fill out for insurance company. Pt is diabetic and I usually refer to podiatrist to order shoe. Is she willing to be referred and does she have form to fill out regarding diabetic shoes?

## 2019-06-22 NOTE — Telephone Encounter (Signed)
Copied from [place] [phone]. Topic: Quick Communication - Rx Refill/Question >> Jun 22, 2019  2:37 PM Erick Blinks wrote: Pt called requesting diabetic shoes  Best contact: [phone]

## 2019-06-23 NOTE — Telephone Encounter (Signed)
Pt states she was told diabetic shoes would be better for her. Pt wants referral to Dr. Prince Rome [place] [address].

## 2019-06-25 ENCOUNTER — Telehealth: Payer: Self-pay | Admitting: Medical

## 2019-06-25 DIAGNOSIS — E119 Type 2 diabetes mellitus without complications: Secondary | ICD-10-CM

## 2019-06-25 NOTE — Telephone Encounter (Signed)
Referral to podiatrist Dr. Prince Rome in [place].

## 2019-06-25 NOTE — Telephone Encounter (Signed)
Referral to Dr. Prince Rome podiatrist placed.

## 2019-06-25 NOTE — Telephone Encounter (Signed)
Opened to review 

## 2019-06-26 ENCOUNTER — Telehealth: Payer: Self-pay

## 2019-06-26 MED ORDER — METRONIDAZOLE 500 MG PO TABS: 500.0000 mg | ORAL_TABLET | Freq: Two times a day (BID) | ORAL | 0 refills | Status: DC

## 2019-06-26 NOTE — Telephone Encounter (Signed)
Patient called and is having a fishy odor for 2-3days like she did before. Patient is not sexually active- can not think of any changes in soaps or detergents.  Patient has limited means of transportation and would like a pill form of something to treat her BV.  Rx sent per protocol.

## 2019-07-05 ENCOUNTER — Telehealth: Payer: Self-pay

## 2019-07-05 NOTE — Telephone Encounter (Signed)
Copied from Laura Obrien [phone]. Topic: General - Other >> Jul 05, 2019  4:28 PM [name] wrote: Reason for CRM: pt stated she also need a referral for diabetic shoes. Please call pt to advise

## 2019-07-07 NOTE — Telephone Encounter (Signed)
Pt called once again requesting referral for diabetic shoes. Pt requests call back

## 2019-07-10 NOTE — Telephone Encounter (Signed)
Spoke with pt Friday notified her I am still working on it. I called [place] [facility] and was told they did not do orders for diabetic shoes whoever is treating pt for diabetes has to fill out forms.

## 2019-07-11 NOTE — Telephone Encounter (Signed)
She doesn't need a new referral just have to change what office it is going too.

## 2019-07-11 NOTE — Telephone Encounter (Signed)
Patient calling again to get the status of her request for diabetic shoes.  Please call patient as soon as you can to let her know the status.  CB# [phone]

## 2019-07-11 NOTE — Telephone Encounter (Signed)
Did you place a new referral in to different podiatrist office. Or just inform [place]. I can't remember what you told me?

## 2019-07-12 ENCOUNTER — Telehealth: Payer: Self-pay | Admitting: Medical

## 2019-07-12 NOTE — Telephone Encounter (Signed)
Laura Obrien is scheduled for 07/28/2019 at [facility] - they do diabetic shoe fittings there. Thanks.

## 2019-07-12 NOTE — Telephone Encounter (Signed)
Could you refer pt to different podiatrist. Pt wants to get diabetic shoes but one she was referred to stated they don't do paperwork associated needed to get shoes. So refer to other please.

## 2019-07-20 ENCOUNTER — Telehealth: Payer: Self-pay

## 2019-07-20 NOTE — Telephone Encounter (Signed)
Copied from [place] [phone] [phone]. Topic: Referral - Question >> Jul 18, 2019  8:48 AM Burchel, Abbi R wrote: Reason for CRM: Pt states she will be unable to make appt for referral to specialist for diabetic shoe fitting.  Pt requesting appt in [place] due to transportation issues.  Please call pt to discuss: [phone]

## 2019-07-20 NOTE — Telephone Encounter (Signed)
I think pt may need to see endo so she can get rx for diabetic shoes.

## 2019-07-25 NOTE — Telephone Encounter (Signed)
As we discussed. She needs to see podiatrist. I know you called and left pt that message. Thanks.

## 2019-07-28 ENCOUNTER — Ambulatory Visit: Payer: Medicare Other | Admitting: Podiatry

## 2019-07-31 ENCOUNTER — Telehealth: Payer: Self-pay | Admitting: Medical

## 2019-07-31 NOTE — Telephone Encounter (Signed)
Pt requesting refill on Diclofenac for back pain. Please advise.

## 2019-07-31 NOTE — Telephone Encounter (Signed)
Patient is requesting a refill of "last pain medication sent to pharmacy" for back pain, as she states she has lost the bottle. Please advise? CB#  [phone]

## 2019-08-01 MED ORDER — DICLOFENAC SODIUM 75 MG PO TBEC: 75.0000 mg | DELAYED_RELEASE_TABLET | Freq: Two times a day (BID) | ORAL | 0 refills | Status: AC

## 2019-08-01 NOTE — Telephone Encounter (Signed)
Rx diclofenac sent to pt pharmacy. 

## 2019-08-18 ENCOUNTER — Telehealth: Payer: Self-pay

## 2019-08-18 DIAGNOSIS — B9689 Other specified bacterial agents as the cause of diseases classified elsewhere: Secondary | ICD-10-CM

## 2019-08-18 MED ORDER — METRONIDAZOLE 500 MG PO TABS: 500.0000 mg | ORAL_TABLET | Freq: Two times a day (BID) | ORAL | 0 refills | Status: AC

## 2019-08-18 NOTE — Telephone Encounter (Signed)
Pt called the office requesting a refill of Flagyl. Pt states she is still experiencing vaginal odor. Medication was sent to the pharmacy.  Hana Trippett l Sherryl Valido, CMA

## 2019-08-29 ENCOUNTER — Other Ambulatory Visit: Payer: Self-pay

## 2019-08-29 MED ORDER — LEVOCETIRIZINE DIHYDROCHLORIDE 5 MG PO TABS: ORAL_TABLET | ORAL | 0 refills | Status: DC

## 2019-09-08 ENCOUNTER — Other Ambulatory Visit: Payer: Self-pay

## 2019-09-08 ENCOUNTER — Encounter: Payer: Self-pay | Admitting: Obstetrics & Gynecology

## 2019-09-08 ENCOUNTER — Ambulatory Visit (INDEPENDENT_AMBULATORY_CARE_PROVIDER_SITE_OTHER): Payer: Medicare Other | Admitting: Obstetrics & Gynecology

## 2019-09-08 ENCOUNTER — Other Ambulatory Visit (HOSPITAL_COMMUNITY)
Admission: RE | Admit: 2019-09-08 | Discharge: 2019-09-08 | Disposition: A | Discharge status: Home / Self Care | Payer: Medicare Other | Source: Ambulatory Visit | Attending: Family Medicine | Admitting: Family Medicine

## 2019-09-08 VITALS — BP 116/79 | HR 72 | Ht 66.0 in | Wt 174.1 lb

## 2019-09-08 DIAGNOSIS — N898 Other specified noninflammatory disorders of vagina: Secondary | ICD-10-CM | POA: Diagnosis not present

## 2019-09-08 NOTE — Progress Notes (Signed)
Pt states that she has been experiencing vaginal discharge and odor x 1 month.

## 2019-09-08 NOTE — Patient Instructions (Addendum)
GO WHITE: Soap: UNSCENTED Dove (white box light green writing) Laundry detergent (underwear)- Dreft or [employer] unscented WHITE 100% cotton panties (NOT just cotton crouch) Sanitary napkin/panty liners: UNSCENTED.  If it doesn't SAY unscented it can have a scent/perfume    NO PERFUMES OR LOTIONS OR POTIONS in the vulvar area (may use regular [place]) Condoms: hypoallergenic only. Non dyed (no color) Toilet papers: white only Wash clothes: use a separate wash cloth. WHITE.  Washed in Dreft.    Vaginitis Vaginitis is a condition in which the vaginal tissue swells and becomes red (inflamed). This condition is most often caused by a change in the normal balance of bacteria and yeast that live in the vagina. This change causes an overgrowth of certain bacteria or yeast, which causes the inflammation. There are different types of vaginitis, but the most common types are:  Bacterial vaginosis.  Yeast infection (candidiasis).  Trichomoniasis vaginitis. This is a sexually transmitted disease (STD).  Viral vaginitis.  Atrophic vaginitis.  Allergic vaginitis. What are the causes? The cause of this condition depends on the type of vaginitis. It can be caused by:  Bacteria (bacterial vaginosis).  Yeast, which is a fungus (yeast infection).  A parasite (trichomoniasis vaginitis).  A virus (viral vaginitis).  Low hormone levels (atrophic vaginitis). Low hormone levels can occur during pregnancy, breastfeeding, or after menopause.  Irritants, such as bubble baths, scented tampons, and feminine sprays (allergic vaginitis). Other factors can change the normal balance of the yeast and bacteria that live in the vagina. These include:  Antibiotic medicines.  Poor hygiene.  Diaphragms, vaginal sponges, spermicides, birth control pills, and intrauterine devices (IUD).  Sex.  Infection.  Uncontrolled diabetes.  A weakened defense (immune) system. What increases the risk? This condition  is more likely to develop in women who:  Smoke.  Use vaginal douches, scented tampons, or scented sanitary pads.  Wear tight-fitting pants.  Wear thong underwear.  Use oral birth control pills or an IUD.  Have sex without a condom.  Have multiple sex partners.  Have an STD.  Frequently use the spermicide nonoxynol-9.  Eat lots of foods high in sugar.  Have uncontrolled diabetes.  Have low estrogen levels.  Have a weakened immune system from an immune disorder or medical treatment.  Are pregnant or breastfeeding. What are the signs or symptoms? Symptoms vary depending on the cause of the vaginitis. Common symptoms include:  Abnormal vaginal discharge. ? The discharge is white, gray, or yellow with bacterial vaginosis. ? The discharge is thick, white, and cheesy with a yeast infection. ? The discharge is frothy and yellow or greenish with trichomoniasis.  A bad vaginal smell. The smell is fishy with bacterial vaginosis.  Vaginal itching, pain, or swelling.  Sex that is painful.  Pain or burning when urinating. Sometimes there are no symptoms. How is this diagnosed? This condition is diagnosed based on your symptoms and medical history. A physical exam, including a pelvic exam, will also be done. You may also have other tests, including:  Tests to determine the pH level (acidity or alkalinity) of your vagina.  A whiff test, to assess the odor that results when a sample of your vaginal discharge is mixed with a potassium hydroxide solution.  Tests of vaginal fluid. A sample will be examined under a microscope. How is this treated? Treatment varies depending on the type of vaginitis you have. Your treatment may include:  Antibiotic creams or pills to treat bacterial vaginosis and trichomoniasis.  Antifungal  medicines, such as vaginal creams or suppositories, to treat a yeast infection.  Medicine to ease discomfort if you have viral vaginitis. Your sexual partner  should also be treated.  Estrogen delivered in a cream, pill, suppository, or vaginal ring to treat atrophic vaginitis. If vaginal dryness occurs, lubricants and moisturizing creams may help. You may need to avoid scented soaps, sprays, or douches.  Stopping use of a product that is causing allergic vaginitis. Then using a vaginal cream to treat the symptoms. Follow these instructions at home: Lifestyle  Keep your genital area clean and dry. Avoid soap, and only rinse the area with water.  Do not douche or use tampons until your health care provider says it is okay to do so. Use sanitary pads, if needed.  Do not have sex until your health care provider approves. When you can return to sex, practice safe sex and use condoms.  Wipe from front to back. This avoids the spread of bacteria from the rectum to the vagina. General instructions  Take over-the-counter and prescription medicines only as told by your health care provider.  If you were prescribed an antibiotic medicine, take or use it as told by your health care provider. Do not stop taking or using the antibiotic even if you start to feel better.  Keep all follow-up visits as told by your health care provider. This is important. How is this prevented?  Use mild, non-scented products. Do not use things that can irritate the vagina, such as fabric softeners. Avoid the following products if they are scented: ? Feminine sprays. ? Detergents. ? Tampons. ? Feminine hygiene products. ? Soaps or bubble baths.  Let air reach your genital area. ? Wear cotton underwear to reduce moisture buildup. ? Avoid wearing underwear while you sleep. ? Avoid wearing tight pants and underwear or nylons without a cotton panel. ? Avoid wearing thong underwear.  Take off any wet clothing, such as bathing suits, as soon as possible.  Practice safe sex and use condoms. Contact a health care provider if:  You have abdominal pain.  You have a  fever.  You have symptoms that last for more than 2-3 days. Get help right away if:  You have a fever and your symptoms suddenly get worse. Summary  Vaginitis is a condition in which the vaginal tissue becomes inflamed.This condition is most often caused by a change in the normal balance of bacteria and yeast that live in the vagina.  Treatment varies depending on the type of vaginitis you have.  Do not douche, use tampons , or have sex until your health care provider approves. When you can return to sex, practice safe sex and use condoms. This information is not intended to replace advice given to you by your health care provider. Make sure you discuss any questions you have with your health care provider. Document Released: 07/12/2007 Document Revised: 08/27/2017 Document Reviewed: 10/20/2016 Elsevier Patient Education  2020 [employer].

## 2019-09-08 NOTE — Progress Notes (Signed)
History:  58 y.o. G0P0 here today for eval of vaginal odor associated with running vvagina discharge. Pt is s/p hysterectomy. She is not sexually active.  She recently took Flagyl on 08/18/2019  And has a course of flagyl prior to that with no relief of sx. She reports that over the past month her sx have become much worse. This is a recurrent problem. Pt has been using an OTC spray for odor.  Pt denies douching or use of other products in this area.  Pt denies pain or any assoc symptoms.    The following portions of the patient's history were reviewed and updated as appropriate: allergies, current medications, past family history, past medical history, past social history, past surgical history and problem list.  Review of Systems:  Pertinent items are noted in HPI.    Objective:  Physical Exam Blood pressure 116/79, pulse 72, height 5\' 6"  (1.676 m), weight 174 lb 1.9 oz (79 kg).  CONSTITUTIONAL: Well-developed, well-nourished female in no acute distress.  HENT:  Normocephalic, atraumatic EYES: Conjunctivae and EOM are normal. No scleral icterus.  NECK: Normal range of motion SKIN: Skin is warm and dry. No rash noted. Not diaphoretic.No pallor. [place]: Alert and oriented to person, place, and time. Normal coordination.  Pelvic: Normal appearing external genitalia; normal appearing vaginal mucosa and cervix.  Normal discharge.    10/10/2018 neg BV 11/02/2018 neg BV  Assessment & Plan:  Vaginal odor and discharge. Worsening  Reviewed GO WHITE instructions with pt  Dove unscented only soap  If BV test neg, rec tap water douche with 1/4 tsp of Baking soda. Reviewed with pt.   F/u Affirm  Pt set up on Mychart  Total face-to-face time with patient was 15 min.  Greater than 50% was spent in counseling and coordination of care with the patient.   Kmari Halter L. Harraway-Smith, M.D., [place]lynn June

## 2019-09-11 LAB — CERVICOVAGINAL ANCILLARY ONLY
Bacterial Vaginitis (gardnerella): NEGATIVE
Candida Glabrata: NEGATIVE
Candida Vaginitis: NEGATIVE
Comment: NEGATIVE
Comment: NEGATIVE
Comment: NEGATIVE
Comment: NEGATIVE
Trichomonas: NEGATIVE

## 2019-10-27 ENCOUNTER — Other Ambulatory Visit: Payer: Self-pay | Admitting: Medical

## 2019-11-29 ENCOUNTER — Other Ambulatory Visit: Payer: Self-pay | Admitting: Medical

## 2019-12-16 ENCOUNTER — Other Ambulatory Visit: Payer: Self-pay | Admitting: Medical

## 2020-01-25 ENCOUNTER — Other Ambulatory Visit: Payer: Self-pay | Admitting: Medical

## 2020-02-05 ENCOUNTER — Other Ambulatory Visit: Payer: Self-pay | Admitting: Medical

## 2020-06-22 IMAGING — MR MR LUMBAR SPINE W/O CM
6 series · 48 of 48 positions shown · non-contrast
Comparison: None.

CLINICAL DATA: Low back pain radiating to both hips and legs over
the last 5 years.

EXAM:
MRI LUMBAR SPINE WITHOUT CONTRAST
TECHNIQUE: Multiplanar, multisequence MR imaging of the lumbar spine was
performed. No intravenous contrast was administered.

[Series 2: T1 · sagittal · 4.0mm · 0.81mm/px · 4 of 13 slices shown (1 of 3)]
[im 1/13]
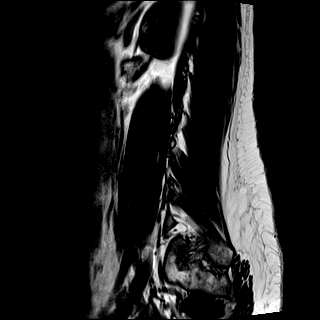
[im 5/13]
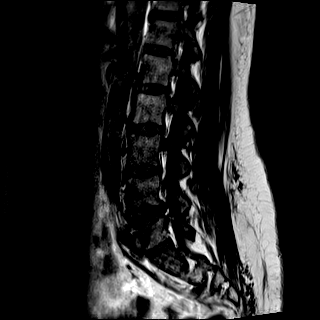
[im 9/13]
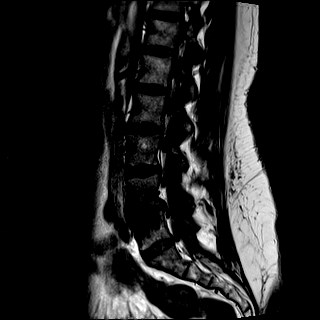
[im 13/13]
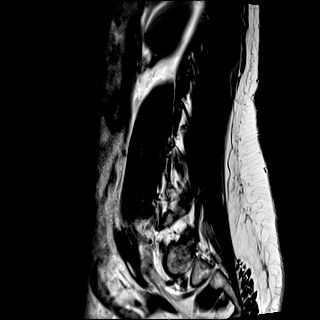

[Series 3: T2 · sagittal · 4.0mm · 0.81mm/px · 4 of 13 slices shown (1 of 2)]
[im 1/13]
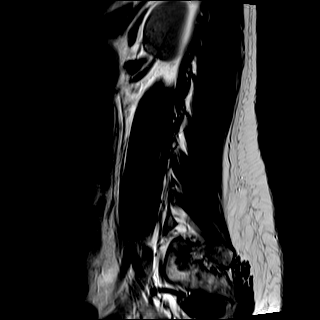
[im 5/13]
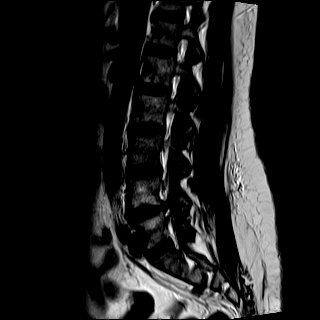
[im 9/13]
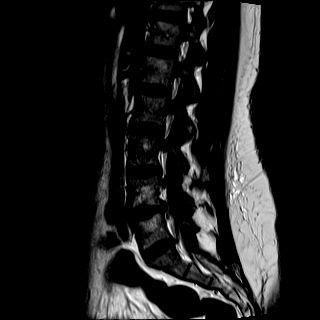
[im 13/13]
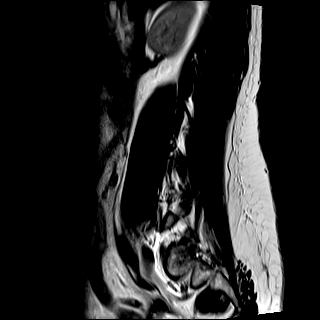

[Series 4: STIR · sagittal · 4.0mm · 1.02mm/px · 4 of 13 slices shown]
[im 1/13]
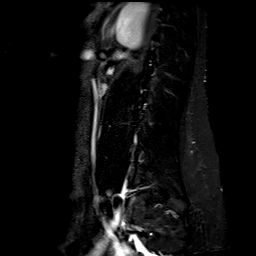
[im 5/13]
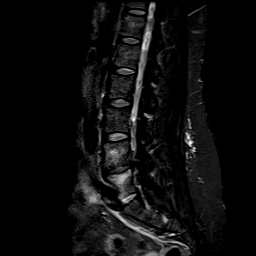
[im 9/13]
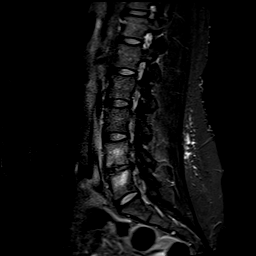
[im 13/13]
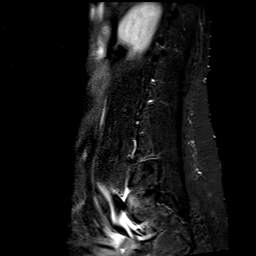

[Series 5: T2 · axial · 4.0mm · 0.62mm/px · z∈[-77,+116]mm · 12 of 37 slices shown (2 of 2)]
[im 1/37]
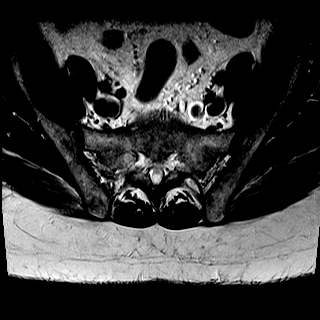
[im 4/37]
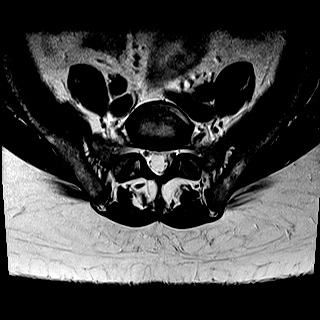
[im 7/37]
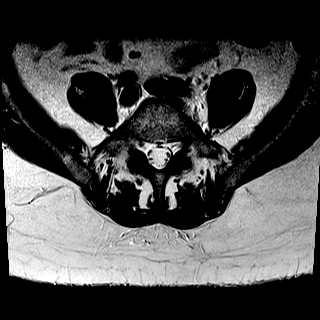
[im 10/37]
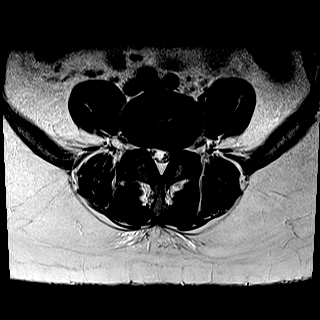
[im 14/37]
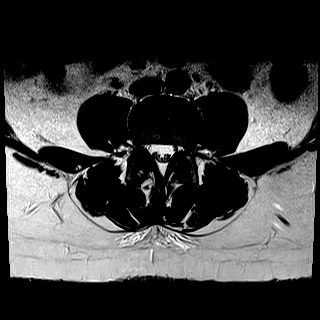
[im 17/37]
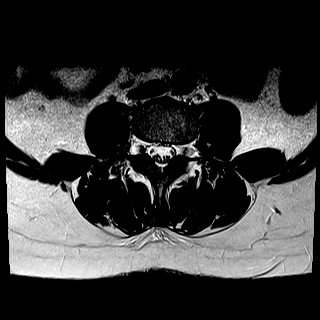
[im 20/37]
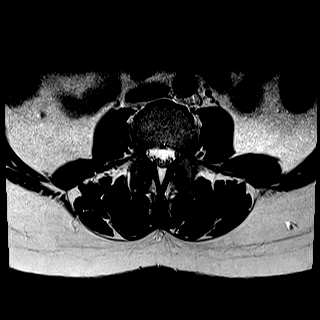
[im 23/37]
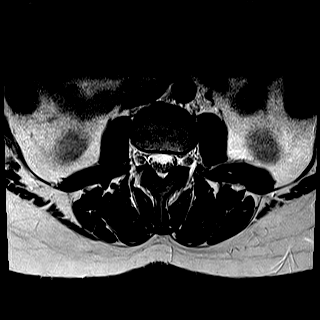
[im 27/37]
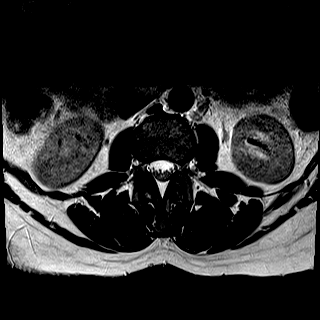
[im 30/37]
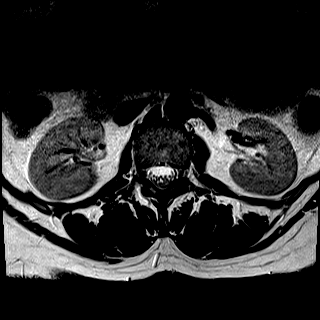
[im 33/37]
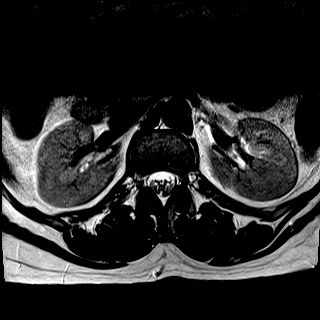
[im 37/37]
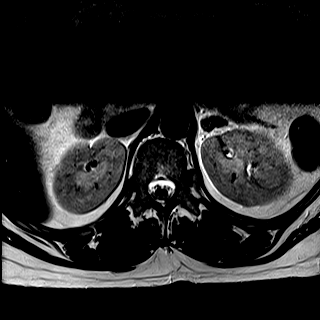

[Series 6: T1 · axial · 4.0mm · 0.78mm/px · z∈[-77,+116]mm · 12 of 37 slices shown (2 of 3)]
[im 1/37]
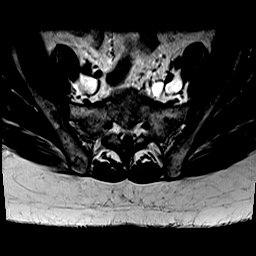
[im 4/37]
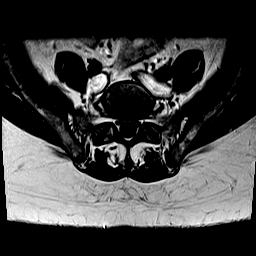
[im 7/37]
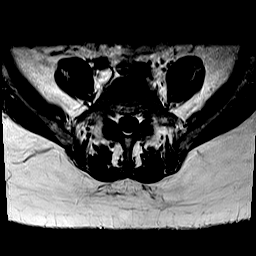
[im 10/37]
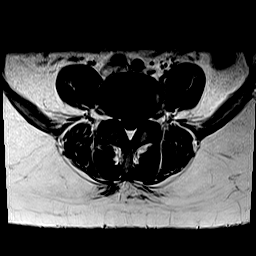
[im 14/37]
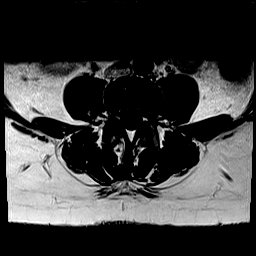
[im 17/37]
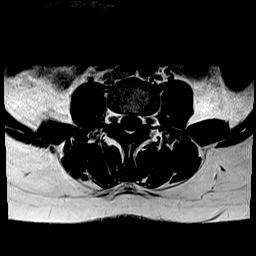
[im 20/37]
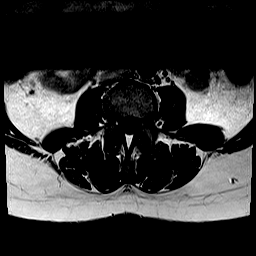
[im 23/37]
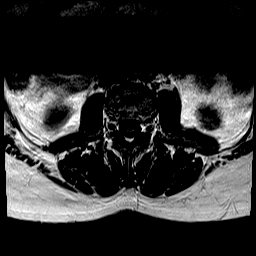
[im 27/37]
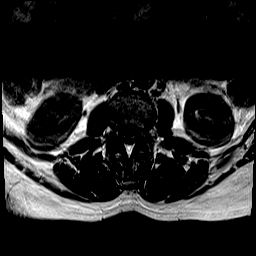
[im 30/37]
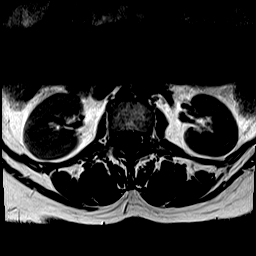
[im 33/37]
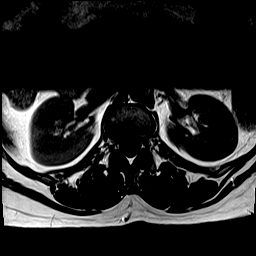
[im 37/37]
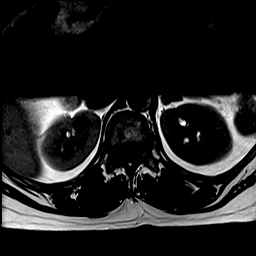

[Series 7: T1 · axial · 4.0mm · 0.78mm/px · z∈[-77,+116]mm · 12 of 37 slices shown (3 of 3)]
[im 1/37]
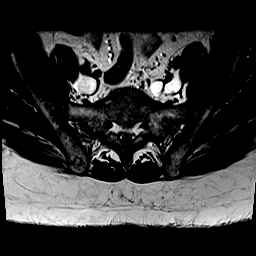
[im 4/37]
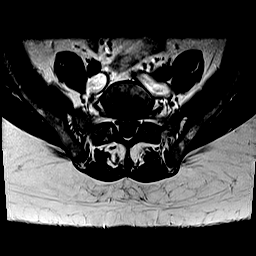
[im 7/37]
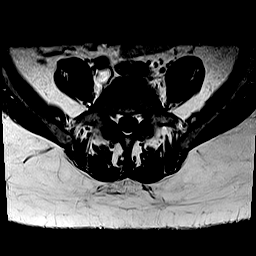
[im 10/37]
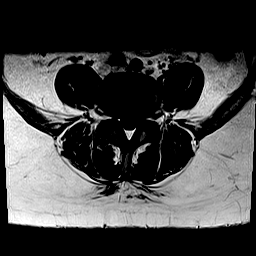
[im 14/37]
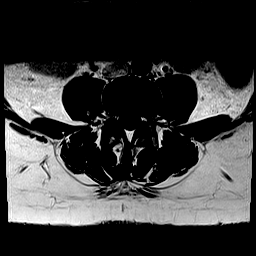
[im 17/37]
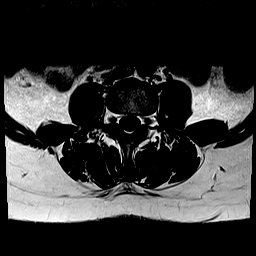
[im 20/37]
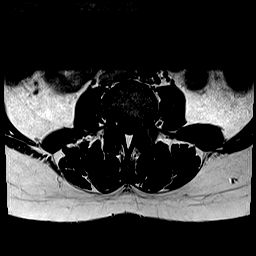
[im 23/37]
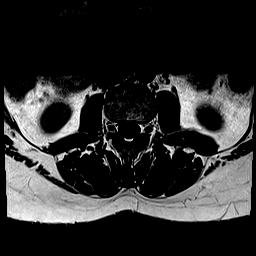
[im 27/37]
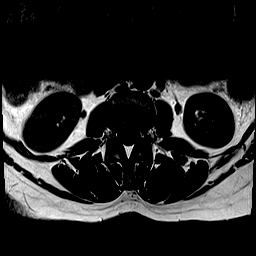
[im 30/37]
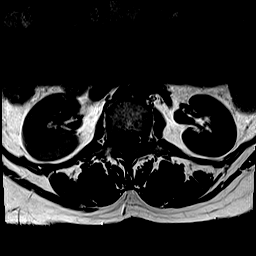
[im 33/37]
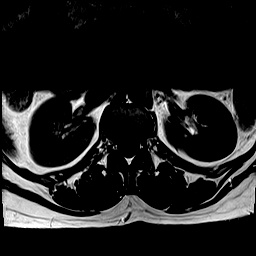
[im 37/37]
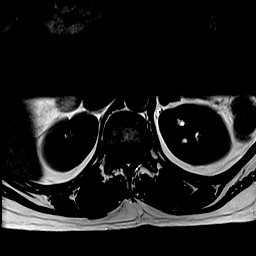

[48 of 48 positions shown; findings below may reference images not displayed]

FINDINGS: Segmentation:  5 lumbar type vertebral bodies.

Alignment:  Normal

Vertebrae: Pronounced discogenic edematous changes at L4 and L5,
likely associated with back pain.

Conus medullaris and cauda equina: Conus extends to the L1 level.
Conus and cauda equina appear normal.

Paraspinal and other soft tissues: Negative

Disc levels:

No abnormality at L2-3 or above.

L3-4: Minimal disc bulge. Minimal facet hypertrophy. No canal or
foraminal stenosis.

L4-5: Disc degeneration with a left paracentral disc herniation that
indents the thecal sac, resulting and lateral recess stenosis left
more than right. This could cause neural compression on either side,
particularly the left. Mild facet hypertrophy. Pronounced discogenic
edema within the L4 and L5 vertebral bodies, likely associated with
back pain.

L5-S1: Normal interspace.
IMPRESSION: The significant findings are likely at the L4-5 level. There is a
left paracentral disc herniation with bilateral lateral recess
narrowing left worse than right. Neural compression quite possible
this level, particularly on the left. There is pronounced discogenic
edema within the L4 and L5 vertebral bodies, likely associated with
back pain.

## 2022-11-25 NOTE — Telephone Encounter (Signed)
Opened in error

## 2023-08-24 ENCOUNTER — Encounter (HOSPITAL_BASED_OUTPATIENT_CLINIC_OR_DEPARTMENT_OTHER): Payer: Self-pay | Admitting: Urology

## 2023-08-24 ENCOUNTER — Emergency Department (HOSPITAL_BASED_OUTPATIENT_CLINIC_OR_DEPARTMENT_OTHER)
Admission: EM | Admit: 2023-08-24 | Discharge: 2023-08-24 | Disposition: A | Discharge status: Home / Self Care | Payer: 59 | Attending: Emergency Medicine | Admitting: Emergency Medicine

## 2023-08-24 ENCOUNTER — Emergency Department (HOSPITAL_BASED_OUTPATIENT_CLINIC_OR_DEPARTMENT_OTHER): Payer: 59

## 2023-08-24 DIAGNOSIS — Z79899 Other long term (current) drug therapy: Secondary | ICD-10-CM | POA: Insufficient documentation

## 2023-08-24 DIAGNOSIS — Z7984 Long term (current) use of oral hypoglycemic drugs: Secondary | ICD-10-CM | POA: Insufficient documentation

## 2023-08-24 DIAGNOSIS — M25571 Pain in right ankle and joints of right foot: Secondary | ICD-10-CM | POA: Insufficient documentation

## 2023-08-24 DIAGNOSIS — S8261XA Displaced fracture of lateral malleolus of right fibula, initial encounter for closed fracture: Secondary | ICD-10-CM | POA: Insufficient documentation

## 2023-08-24 DIAGNOSIS — I10 Essential (primary) hypertension: Secondary | ICD-10-CM | POA: Insufficient documentation

## 2023-08-24 DIAGNOSIS — E119 Type 2 diabetes mellitus without complications: Secondary | ICD-10-CM | POA: Diagnosis not present

## 2023-08-24 DIAGNOSIS — M25562 Pain in left knee: Secondary | ICD-10-CM | POA: Diagnosis not present

## 2023-08-24 DIAGNOSIS — W19XXXA Unspecified fall, initial encounter: Secondary | ICD-10-CM

## 2023-08-24 DIAGNOSIS — M545 Low back pain, unspecified: Secondary | ICD-10-CM | POA: Insufficient documentation

## 2023-08-24 DIAGNOSIS — S82831A Other fracture of upper and lower end of right fibula, initial encounter for closed fracture: Secondary | ICD-10-CM

## 2023-08-24 DIAGNOSIS — S8991XA Unspecified injury of right lower leg, initial encounter: Secondary | ICD-10-CM | POA: Diagnosis present

## 2023-08-24 NOTE — ED Notes (Signed)
Patient transported to X-ray 

## 2023-08-24 NOTE — Discharge Instructions (Signed)
You were seen in the ER after a fall.  As we discussed, you did break the bottom of your fibula, or the bone making up the outside/exterior portion of your ankle. This is treated with immobilization, which we have given you the proper boot. You can walk as tolerated while in the boot.   I'd like you to follow up with the orthopedist in 1-2 weeks and I have attached their contact information for you to call and make an appointment to be seen.  Continue to monitor how you're doing and return to the ER for new or worsening symptoms.

## 2023-08-24 NOTE — ED Provider Notes (Signed)
[place] EMERGENCY DEPARTMENT AT [facility] Provider Note   CSN: [id number] Arrival date & time: 08/24/23  1352     History  Chief Complaint  Patient presents with   Marletta Lor    Laura Obrien is a 62 y.o. female with history of uterine fibroids, fibromyalgia, HTN, HLD, diabetes, who presents to the ER after a fall. Patient states she fell yesterday, she slipped and twisted her right ankle, also struck her back falling backwards. Denies head trauma or LOC. Now complaining of pain in right  ankle with swelling as well as L knee and lower back pain. Right after the fall she had to catch a flight from [place] to [facility] and believes having it down for that long increased the swelling. She took her prescribed fibromyalgia medication for pain, has not taken anything else. She does not take blood thinners. Does not require any ambulation devices at baseline.      Fall       Home Medications Prior to Admission medications   Medication Sig Start Date End Date Taking? Authorizing Provider  atorvastatin (LIPITOR) 10 MG tablet TAKE 1 TABLET(10 MG) BY MOUTH DAILY 11/29/19   Saguier, Ramon Dredge, PA-C  Blood Glucose Monitoring Suppl [facility] VERIO) w/Device KIT Check blood sugar once daily 06/08/19   Saguier, Ramon Dredge, PA-C  Clobetasol Prop Emollient Base (CLOBETASOL PROPIONATE E) 0.05 % emollient cream Apply 1 application topically 1 day or 1 dose. Use q hs daily x 6 wks, then go to 2x/wk Patient not taking: Reported on 09/08/2019 03/23/18   Reva Bores, MD  diclofenac (VOLTAREN) 75 MG EC tablet Take 1 tablet (75 mg total) by mouth 2 (two) times daily. 08/01/19   Saguier, Ramon Dredge, PA-C  fluticasone [facility]) 50 MCG/ACT nasal spray SHAKE LIQUID AND USE 2 SPRAYS IN [facility] NOSTRIL DAILY 01/25/20   Saguier, Ramon Dredge, PA-C  glucose blood test strip Check blood sugar twice daily,. (E11.9) 01/25/19   Saguier, Ramon Dredge, PA-C  levocetirizine (XYZAL) 5 MG tablet TAKE 1 TABLET(5 MG) BY MOUTH EVERY EVENING  02/05/20   Saguier, Ramon Dredge, PA-C  metaxalone (SKELAXIN) 800 MG tablet 1 tab po q hs prn muscle spasm 06/12/19   Saguier, Ramon Dredge, PA-C  metFORMIN (GLUCOPHAGE) 500 MG tablet Take 1 tablet (500 mg total) by mouth daily with breakfast. Patient not taking: Reported on 09/08/2019 01/27/19   Saguier, Ramon Dredge, PA-C  metroNIDAZOLE (FLAGYL) 500 MG tablet Take 1 tablet (500 mg total) by mouth 2 (two) times daily. Patient not taking: Reported on 09/08/2019 08/18/19   Willodean Rosenthal, MD  Milnacipran (SAVELLA) 50 MG TABS tablet Take 1 tablet (50 mg total) by mouth 2 (two) times daily. 10/19/18   Saguier, Ramon Dredge, PA-C  ONETOUCH VERIO test strip TEST EVERY DAY AT ALTERNATING TIMES 01/27/19   Saguier, Ramon Dredge, PA-C  traZODone (DESYREL) 50 MG tablet TAKE 1/2 TO 1 TABLET(25 TO 50 MG) BY MOUTH AT BEDTIME AS NEEDED FOR SLEEP 12/18/19   Saguier, Ramon Dredge, PA-C      Allergies    Patient has no known allergies.    Review of Systems   Review of Systems  Musculoskeletal:  Positive for arthralgias and back pain.  All other systems reviewed and are negative.   Physical Exam Updated Vital Signs BP (!) 146/87 (BP Location: Left Arm)   Pulse 92   Temp 97.7 F (36.5 C)   Resp 18   Ht 5\' 6"  (1.676 m)   Wt 73.5 kg   SpO2 97%   BMI 26.15 kg/m  Physical Exam  Vitals and nursing note reviewed.  Constitutional:      Appearance: Normal appearance.  HENT:     Head: Normocephalic and atraumatic.  Eyes:     Conjunctiva/sclera: Conjunctivae normal.  Cardiovascular:     Pulses:          Posterior tibial pulses are 2+ on the right side and 2+ on the left side.  Pulmonary:     Effort: Pulmonary effort is normal. No respiratory distress.  Musculoskeletal:     Comments: Full passive ROM of all regions of spine.  Generalized paraspinal muscular tenderness to palpation in lumbar region.  Some midline spinal tenderness, but no step-offs or crepitus.  Strength 5/5 in all extremities.  Sensation intact in all extremities.  L  knee without swelling or focal tenderness. Compartments of bilateral legs are soft, no swelling.   Feet:     Comments: Right ankle with significant swelling and ecchymoses laterally, pain with ROM, normal ROM of the digits, normal sensation, no open wounds Skin:    General: Skin is warm and dry.  Neurological:     Mental Status: She is alert.  Psychiatric:        Mood and Affect: Mood normal.        Behavior: Behavior normal.     ED Results / Procedures / Treatments   Labs (all labs ordered are listed, but only abnormal results are displayed) Labs Reviewed - No data to display  EKG None  Radiology DG Ankle Complete Right  Result Date: 08/24/2023 CLINICAL DATA:  Pain after fall EXAM: RIGHT ANKLE - COMPLETE 3 VIEW COMPARISON:  None Available. FINDINGS: Soft tissue swelling about the ankle. Greatest lateral. There is minimally displaced oblique distal fibular metaphyseal fracture. No additional fracture or dislocation. Preserved bone mineralization and joint spaces. IMPRESSION: Minimally displaced oblique distal fibular metaphyseal fracture. Soft tissue swelling Electronically Signed   By: Karen Kays M.D.   On: 08/24/2023 16:29   DG Lumbar Spine Complete  Result Date: 08/24/2023 CLINICAL DATA:  Pain after fall EXAM: LUMBAR SPINE - COMPLETE 5 VIEW COMPARISON:  MRI 11/12/2018 FINDINGS: Five lumbar-type vertebral bodies. Preserved vertebral body heights and alignment. No listhesis. There is significant sclerosis of the endplates at L4-5 with osteophyte formation. Schmorl's node deformity along the superior endplate of L5. Changes are progressive from the study of 2020. Associated lower lumbar facet degenerative changes. No spondylolysis. No obvious acute fracture. However recommend continue precautions until clinical clearance and if there is further concern of acute injury additional workup with CT as clinically appropriate for further sensitivity. IMPRESSION: Progressive degenerative  changes at L4-5. Electronically Signed   By: Karen Kays M.D.   On: 08/24/2023 16:28   DG Knee Complete 4 Views Left  Result Date: 08/24/2023 CLINICAL DATA:  Pain after fall EXAM: LEFT KNEE - COMPLETE 4 VIEW COMPARISON:  None Available. FINDINGS: No evidence of fracture, dislocation, or joint effusion. No evidence of arthropathy or other focal bone abnormality. Soft tissues are unremarkable. IMPRESSION: No acute osseous abnormality. Electronically Signed   By: Karen Kays M.D.   On: 08/24/2023 16:26    Procedures .Ortho Injury Treatment  Date/Time: 08/24/2023 4:53 PM  Performed by: Su Monks, PA-C Authorized by: Su Monks, PA-C   Consent:    Consent obtained:  Verbal   Consent given by:  Patient   Risks discussed:  Restricted joint movement and stiffness   Alternatives discussed:  No treatmentInjury location: ankle Location details: right ankle Injury type: fracture Fracture type:  lateral malleolus Pre-procedure neurovascular assessment: neurovascularly intact Pre-procedure distal perfusion: normal Pre-procedure neurological function: normal Pre-procedure range of motion: reduced  Anesthesia: Local anesthesia used: no  Patient sedated: NoManipulation performed: no Immobilization: brace Splint type: CAM walker boot. Splint Applied by: ED Tech Post-procedure neurovascular assessment: post-procedure neurovascularly intact Post-procedure distal perfusion: normal Post-procedure neurological function: normal Post-procedure range of motion: unchanged       Medications Ordered in ED Medications - No data to display  ED Course/ Medical Decision Making/ A&P                                 Medical Decision Making Amount and/or Complexity of Data Reviewed Radiology: ordered.   This patient is a 62 y.o. female  who presents to the ED for concern of injuries after fall. Complaining of R ankle, L knee, and lower back pain.   Differential diagnoses prior  to evaluation: The emergent differential diagnosis includes, but is not limited to,  fractures, dislocations, ligamentous injuries, spinal cord injury. This is not an exhaustive differential.   Past Medical History / Co-morbidities / Social History: uterine fibroids, fibromyalgia, HTN, HLD, diabetes  Physical Exam: Physical exam performed. The pertinent findings include: swelling and bruising of R ankle, L ankle normal, lumbar spine with paraspinal tenderness, no step offs or crepitus  Lab Tests/Imaging studies: I personally interpreted labs/imaging and the pertinent results include:  XR of right ankle shows minimally displaced oblique fracture of the distal fibular metaphysis. XR's of left knee and lumbar spine without acute abnormalities. I agree with the radiologist interpretation.  Disposition: After consideration of the diagnostic results and the patients response to treatment, I feel that emergency department workup does not suggest an emergent condition requiring admission or immediate intervention beyond what has been performed at this time. The plan is: discharge to home with treatment of stable ankle fracture. Given CAM walker boot and instructed to follow up with orthopedics. Recommended RICE method as well. Offered crutches but patient declined, instructed her she can be weight bearing as tolerated. The patient is safe for discharge and has been instructed to return immediately for worsening symptoms, change in symptoms or any other concerns.  Final Clinical Impression(s) / ED Diagnoses Final diagnoses:  Other closed fracture of distal end of right fibula, initial encounter  Fall, initial encounter  Acute midline low back pain without sciatica  Acute pain of left knee    Rx / DC Orders ED Discharge Orders     None      Portions of this report may have been transcribed using voice recognition software. Every effort was made to ensure accuracy; however, inadvertent  computerized transcription errors may be present.    Jeanella Flattery 08/24/23 1655    Maia Plan, MD 08/24/23 1819

## 2023-08-24 NOTE — ED Triage Notes (Signed)
Pt in wheelchair to triage  States fall yesterday, denies head injury or LOC  States right leg swelling to lower leg Also states lower back pain   No blood thinners

## 2024-08-22 ENCOUNTER — Other Ambulatory Visit: Payer: Self-pay

## 2024-08-22 ENCOUNTER — Encounter (HOSPITAL_BASED_OUTPATIENT_CLINIC_OR_DEPARTMENT_OTHER): Payer: Self-pay | Admitting: Emergency Medicine

## 2024-08-22 ENCOUNTER — Emergency Department (HOSPITAL_BASED_OUTPATIENT_CLINIC_OR_DEPARTMENT_OTHER)
Admission: EM | Admit: 2024-08-22 | Discharge: 2024-08-22 | Disposition: A | Discharge status: Home / Self Care | Attending: Emergency Medicine | Admitting: Emergency Medicine

## 2024-08-22 DIAGNOSIS — Z79899 Other long term (current) drug therapy: Secondary | ICD-10-CM | POA: Insufficient documentation

## 2024-08-22 DIAGNOSIS — R300 Dysuria: Secondary | ICD-10-CM | POA: Diagnosis present

## 2024-08-22 DIAGNOSIS — Z7984 Long term (current) use of oral hypoglycemic drugs: Secondary | ICD-10-CM | POA: Insufficient documentation

## 2024-08-22 DIAGNOSIS — N3001 Acute cystitis with hematuria: Secondary | ICD-10-CM | POA: Insufficient documentation

## 2024-08-22 LAB — URINALYSIS, MICROSCOPIC (REFLEX)

## 2024-08-22 LAB — URINALYSIS, ROUTINE W REFLEX MICROSCOPIC
Bilirubin Urine: NEGATIVE
Glucose, UA: NEGATIVE mg/dL
Ketones, ur: NEGATIVE mg/dL
Nitrite: NEGATIVE
Protein, ur: 100 mg/dL — AB
Specific Gravity, Urine: 1.015 (ref 1.005–1.030)
pH: 8.5 — ABNORMAL HIGH (ref 5.0–8.0)

## 2024-08-22 MED ORDER — PHENAZOPYRIDINE HCL 95 MG PO TABS: 95.0000 mg | ORAL_TABLET | Freq: Three times a day (TID) | ORAL | 0 refills | Status: DC | PRN

## 2024-08-22 MED ORDER — CEPHALEXIN 500 MG PO CAPS: 500.0000 mg | ORAL_CAPSULE | Freq: Two times a day (BID) | ORAL | 0 refills | Status: AC

## 2024-08-22 MED ORDER — PHENAZOPYRIDINE HCL 95 MG PO TABS: 95.0000 mg | ORAL_TABLET | Freq: Three times a day (TID) | ORAL | 0 refills | Status: AC | PRN

## 2024-08-22 NOTE — ED Provider Notes (Signed)
 [place] EMERGENCY DE[facility]MENT AT [facility] Provider Note   CSN: [id number] Arrival date & time: 08/22/24  1141     Patient presents with: Dysuria   Laura Obrien is a 63 y.o. female.   Patient is here for for evaluation of dysuria and suprapubic pain that started earlier today.  She is accompanied by a female friend today.  She denies fevers, N/V/D, constipation, back pain, or inability to urinate.  She does report when she does urinate it is small dribbles versus full urination.  She reports incomplete emptying of her bladder.  She denies any frank hematuria. She denies shortness of breath, chest pain, palpitations, dizziness, or syncope.  The history is provided by the patient and a friend.  Dysuria Associated symptoms: no fever, no flank pain and no vomiting        Prior to Admission medications   Medication Sig Start Date End Date Taking? Authorizing Provider  cephALEXin  (KEFLEX ) 500 MG capsule Take 1 capsule (500 mg total) by mouth 2 (two) times daily for 7 days. 08/22/24 08/29/24 Yes Rosina Almarie LABOR, PA-C  atorvastatin  (LIPITOR) 10 MG tablet TAKE 1 TABLET(10 MG) BY MOUTH DAILY 11/29/19   Saguier, [place], PA-C  Blood Glucose Monitoring Suppl [facility] VERIO) w/Device KIT Check blood sugar once daily 06/08/19   Saguier, [place], PA-C  Clobetasol  Prop Emollient Base (CLOBETASOL  PROPIONATE E) 0.05 % emollient cream Apply 1 application topically 1 day or 1 dose. Use q hs daily x 6 wks, then go to 2x/wk Patient not taking: Reported on 09/08/2019 03/23/18   Fredirick Glenys RAMAN, MD  diclofenac  (VOLTAREN ) 75 MG EC tablet Take 1 tablet (75 mg total) by mouth 2 (two) times daily. 08/01/19   Saguier, [place], PA-C  fluticasone  (FLONASE ) 50 MCG/ACT nasal spray SHAKE LIQUID AND USE 2 SPRAYS IN EACH NOSTRIL DAILY 01/25/20   Saguier, [place], PA-C  glucose blood test strip Check blood sugar twice daily,. (E11.9) 01/25/19   Saguier, [place], PA-C  levocetirizine (XYZAL ) 5 MG tablet TAKE  1 TABLET(5 MG) BY MOUTH EVERY EVENING 02/05/20   Saguier, [place], PA-C  metaxalone  (SKELAXIN ) 800 MG tablet 1 tab po q hs prn muscle spasm 06/12/19   Saguier, [place], PA-C  metFORMIN  (GLUCOPHAGE ) 500 MG tablet Take 1 tablet (500 mg total) by mouth daily with breakfast. Patient not taking: Reported on 09/08/2019 01/27/19   Saguier, [place], PA-C  metroNIDAZOLE  (FLAGYL ) 500 MG tablet Take 1 tablet (500 mg total) by mouth 2 (two) times daily. Patient not taking: Reported on 09/08/2019 08/18/19   Corene Coy, MD  Milnacipran  (SAVELLA ) 50 MG TABS tablet Take 1 tablet (50 mg total) by mouth 2 (two) times daily. 10/19/18   Saguier, [place], PA-C  ONETOUCH VERIO test strip TEST EVERY DAY AT ALTERNATING TIMES 01/27/19   Saguier, [place], PA-C  phenazopyridine  (PYRIDIUM ) 95 MG tablet Take 1 tablet (95 mg total) by mouth 3 (three) times daily as needed for up to 2 days for pain. 08/22/24 08/24/24  Rosina Almarie LABOR, PA-C  traZODone  (DESYREL ) 50 MG tablet TAKE 1/2 TO 1 TABLET(25 TO 50 MG) BY MOUTH AT BEDTIME AS NEEDED FOR SLEEP 12/18/19   Saguier, [place], PA-C    Allergies: Patient has no known allergies.    Review of Systems  Constitutional:  Negative for fever.  Gastrointestinal:  Negative for vomiting.  Genitourinary:  Positive for dysuria. Negative for flank pain.    Updated Vital Signs BP (!) 150/92   Pulse (!) 137   Temp (!) 97.5 F (36.4  C) (Oral)   Resp 18   Ht 5' 6 (1.676 m)   SpO2 100%   BMI 26.15 kg/m   Physical Exam Vitals and nursing note reviewed.  Constitutional:      Appearance: Normal appearance.  HENT:     Head: Normocephalic and atraumatic.     Mouth/Throat:     Mouth: Mucous membranes are moist.  Eyes:     General: No scleral icterus.    Extraocular Movements: Extraocular movements intact.     Conjunctiva/sclera: Conjunctivae normal.  Cardiovascular:     Rate and Rhythm: Normal rate and regular rhythm.     Pulses: Normal pulses.     Heart sounds: Normal heart  sounds.  Pulmonary:     Effort: Pulmonary effort is normal. No respiratory distress.     Breath sounds: Normal breath sounds. No stridor. No wheezing, rhonchi or rales.  Abdominal:     General: Abdomen is flat. Bowel sounds are normal. There is no distension.     Palpations: Abdomen is soft.     Tenderness: There is abdominal tenderness in the suprapubic area. There is guarding. There is no right CVA tenderness or left CVA tenderness. Negative signs include Murphy's sign, Rovsing's sign and McBurney's sign.  Skin:    General: Skin is warm and dry.     Capillary Refill: Capillary refill takes less than 2 seconds.     Coloration: Skin is not jaundiced or pale.  Neurological:     Mental Status: She is alert and oriented to person, place, and time.     (all labs ordered are listed, but only abnormal results are displayed) Labs Reviewed  URINALYSIS, ROUTINE W REFLEX MICROSCOPIC - Abnormal; Notable for the following components:      Result Value   APPearance CLOUDY (*)    pH 8.5 (*)    Hgb urine dipstick MODERATE (*)    Protein, ur 100 (*)    Leukocytes,Ua TRACE (*)    All other components within normal limits  URINALYSIS, MICROSCOPIC (REFLEX) - Abnormal; Notable for the following components:   Bacteria, UA FEW (*)    All other components within normal limits  URINE CULTURE    EKG: None  Radiology: No results found.   Procedures   Medications Ordered in the ED - No data to display   Patient presents to the ED for concern of dysuria and suprapubic pain, this involves an extensive number of treatment options, and is a complaint that carries with it a high risk of complications and morbidity.  The differential diagnosis includes pyelonephritis, sepsis, kidney stone, UTI, acute cystitis.   Lab Tests:  I Ordered, and personally interpreted labs.  The pertinent results include: Urinalysis indicative of infection.   Problem List / ED Course:  Patient is here for evaluation  of dysuria and suprapubic pain.  Considering HPI, urinalysis, and physical exam findings, the patient likely has acute cystitis.  Urine cultures sent.   Reevaluation:  After the interventions noted above, I reevaluated the patient and found that they have :stayed the same   Dispostion:  After consideration of the diagnostic results and the patients response to treatment, I feel that the patent would benefit from supportive care in the home setting with antibiotic course and short course of pain management with Pyridium .  Spoke with patient about return precautions.  Medical Decision Making Amount and/or Complexity of Data Reviewed Labs: ordered.      Final diagnoses:  Acute cystitis with hematuria  ED Discharge Orders          Ordered    phenazopyridine  (PYRIDIUM ) 95 MG tablet  3 times daily PRN,   Status:  Discontinued        08/22/24 1311    cephALEXin  (KEFLEX ) 500 MG capsule  2 times daily        08/22/24 1311    phenazopyridine  (PYRIDIUM ) 95 MG tablet  3 times daily PRN        08/22/24 1315               Anissa Abbs A, PA-C 08/22/24 1340    Bernard Drivers, MD 08/23/24 [phone]

## 2024-08-22 NOTE — Discharge Instructions (Addendum)
 It was a pleasure meeting with you today. Based on your symptoms, you likely have an infection of your urinary tract or bladder. Treatment for these is the same, which is a course of antibiotics which has been sent to your pharmacy. I have also sent pyridium  which will help with the pain you are experiencing with urination until the antibiotic has time to clear the infection. Please return if you develop fevers, pain worsens, or you develop any other concerning symptoms.

## 2024-08-22 NOTE — ED Triage Notes (Signed)
 Pt c/o suprapubic pain 2 weeks ago, seen for same at that time, pain returned this AM.   C/o dysuria, difficulty with urination. Denies fever.

## 2024-08-23 LAB — URINE CULTURE
